# Patient Record
Sex: Female | Born: 1955 | Race: White | Hispanic: No | Marital: Single | Smoking: Former smoker
Health system: Southern US, Community
[De-identification: ages and names within clinical notes are randomized; demographics above are authoritative.]

## PROBLEM LIST (undated history)

## (undated) DIAGNOSIS — I10 Essential (primary) hypertension: Secondary | ICD-10-CM

## (undated) DIAGNOSIS — M1990 Unspecified osteoarthritis, unspecified site: Secondary | ICD-10-CM

## (undated) DIAGNOSIS — E079 Disorder of thyroid, unspecified: Secondary | ICD-10-CM

## (undated) DIAGNOSIS — F329 Major depressive disorder, single episode, unspecified: Secondary | ICD-10-CM

## (undated) DIAGNOSIS — M797 Fibromyalgia: Secondary | ICD-10-CM

## (undated) DIAGNOSIS — E119 Type 2 diabetes mellitus without complications: Secondary | ICD-10-CM

## (undated) DIAGNOSIS — F32A Depression, unspecified: Secondary | ICD-10-CM

## (undated) HISTORY — PX: HAND SURGERY: SHX662

## (undated) HISTORY — PX: THYROID SURGERY: SHX805

## (undated) HISTORY — PX: REPLACEMENT TOTAL KNEE BILATERAL: SUR1225

## (undated) HISTORY — PX: APPENDECTOMY: SHX54

## (undated) HISTORY — PX: ABDOMINAL HYSTERECTOMY: SHX81

---

## 2014-01-09 ENCOUNTER — Emergency Department: Payer: Self-pay | Admitting: Emergency Medicine

## 2014-01-09 LAB — COMPREHENSIVE METABOLIC PANEL
AST: 31 U/L (ref 15–37)
Albumin: 3.1 g/dL — ABNORMAL LOW (ref 3.4–5.0)
Alkaline Phosphatase: 75 U/L
Anion Gap: 3 — ABNORMAL LOW (ref 7–16)
BILIRUBIN TOTAL: 0.5 mg/dL (ref 0.2–1.0)
BUN: 11 mg/dL (ref 7–18)
CALCIUM: 8.5 mg/dL (ref 8.5–10.1)
CHLORIDE: 94 mmol/L — AB (ref 98–107)
CREATININE: 1.02 mg/dL (ref 0.60–1.30)
Co2: 32 mmol/L (ref 21–32)
EGFR (African American): >60
EGFR (Non-African Amer.): 59 — ABNORMAL LOW
Glucose: 189 mg/dL — ABNORMAL HIGH (ref 65–99)
Osmolality: 263 (ref 275–301)
POTASSIUM: 5.1 mmol/L (ref 3.5–5.1)
SGPT (ALT): 37 U/L
SODIUM: 129 mmol/L — AB (ref 136–145)
Total Protein: 7.1 g/dL (ref 6.4–8.2)

## 2014-01-09 LAB — CBC
HCT: 46.4 % (ref 35.0–47.0)
HGB: 14.7 g/dL (ref 12.0–16.0)
MCH: 29.9 pg (ref 26.0–34.0)
MCHC: 31.6 g/dL — AB (ref 32.0–36.0)
MCV: 95 fL (ref 80–100)
PLATELETS: 155 10*3/uL (ref 150–440)
RBC: 4.91 10*6/uL (ref 3.80–5.20)
RDW: 15.3 % — ABNORMAL HIGH (ref 11.5–14.5)
WBC: 7.4 10*3/uL (ref 3.6–11.0)

## 2014-01-09 LAB — TROPONIN I: Troponin-I: 0.03 ng/mL

## 2014-01-13 ENCOUNTER — Emergency Department: Payer: Self-pay | Admitting: Internal Medicine

## 2014-01-13 LAB — CBC
HCT: 43.6 % (ref 35.0–47.0)
HGB: 13.9 g/dL (ref 12.0–16.0)
MCH: 29.8 pg (ref 26.0–34.0)
MCHC: 31.8 g/dL — AB (ref 32.0–36.0)
MCV: 94 fL (ref 80–100)
Platelet: 143 10*3/uL — ABNORMAL LOW (ref 150–440)
RBC: 4.65 10*6/uL (ref 3.80–5.20)
RDW: 15.4 % — ABNORMAL HIGH (ref 11.5–14.5)
WBC: 7.4 10*3/uL (ref 3.6–11.0)

## 2014-01-13 LAB — COMPREHENSIVE METABOLIC PANEL
ANION GAP: 5 — AB (ref 7–16)
Albumin: 3 g/dL — ABNORMAL LOW (ref 3.4–5.0)
Alkaline Phosphatase: 64 U/L
BUN: 18 mg/dL (ref 7–18)
Bilirubin,Total: 0.5 mg/dL (ref 0.2–1.0)
CALCIUM: 7.9 mg/dL — AB (ref 8.5–10.1)
CO2: 35 mmol/L — AB (ref 21–32)
Chloride: 95 mmol/L — ABNORMAL LOW (ref 98–107)
Creatinine: 0.76 mg/dL (ref 0.60–1.30)
EGFR (African American): >60
EGFR (Non-African Amer.): >60
GLUCOSE: 157 mg/dL — AB (ref 65–99)
Osmolality: 275 (ref 275–301)
Potassium: 4.2 mmol/L (ref 3.5–5.1)
SGOT(AST): 21 U/L (ref 15–37)
SGPT (ALT): 35 U/L
SODIUM: 135 mmol/L — AB (ref 136–145)
TOTAL PROTEIN: 6.9 g/dL (ref 6.4–8.2)

## 2014-01-13 LAB — URINALYSIS, COMPLETE
BACTERIA: NONE SEEN
BILIRUBIN, UR: NEGATIVE
Glucose,UR: NEGATIVE mg/dL (ref 0–75)
Ketone: NEGATIVE
Leukocyte Esterase: NEGATIVE
Nitrite: NEGATIVE
Ph: 5 (ref 4.5–8.0)
RBC,UR: 23 /HPF (ref 0–5)
SPECIFIC GRAVITY: 1.025 (ref 1.003–1.030)
Squamous Epithelial: 6
WBC UR: NONE SEEN /HPF (ref 0–5)

## 2014-01-13 LAB — CK TOTAL AND CKMB (NOT AT ARMC)
CK, Total: 38 U/L
CK-MB: 1.4 ng/mL (ref 0.5–3.6)

## 2014-01-13 LAB — TROPONIN I: TROPONIN-I: 0.04 ng/mL

## 2014-01-13 LAB — PRO B NATRIURETIC PEPTIDE: B-Type Natriuretic Peptide: 1445 pg/mL — ABNORMAL HIGH (ref 0–125)

## 2014-02-01 ENCOUNTER — Emergency Department: Payer: Self-pay | Admitting: Internal Medicine

## 2014-02-01 LAB — CBC
HCT: 44.1 % (ref 35.0–47.0)
HGB: 14 g/dL (ref 12.0–16.0)
MCH: 30 pg (ref 26.0–34.0)
MCHC: 31.9 g/dL — ABNORMAL LOW (ref 32.0–36.0)
MCV: 94 fL (ref 80–100)
PLATELETS: 115 10*3/uL — AB (ref 150–440)
RBC: 4.68 10*6/uL (ref 3.80–5.20)
RDW: 16.3 % — ABNORMAL HIGH (ref 11.5–14.5)
WBC: 5.5 10*3/uL (ref 3.6–11.0)

## 2014-02-01 LAB — BASIC METABOLIC PANEL
Anion Gap: 6 — ABNORMAL LOW (ref 7–16)
BUN: 14 mg/dL (ref 7–18)
CO2: 36 mmol/L — AB (ref 21–32)
Calcium, Total: 8.3 mg/dL — ABNORMAL LOW (ref 8.5–10.1)
Chloride: 98 mmol/L (ref 98–107)
Creatinine: 0.86 mg/dL (ref 0.60–1.30)
EGFR (Non-African Amer.): >60
Glucose: 157 mg/dL — ABNORMAL HIGH (ref 65–99)
Osmolality: 283 (ref 275–301)
Potassium: 4.1 mmol/L (ref 3.5–5.1)
Sodium: 140 mmol/L (ref 136–145)

## 2014-02-01 LAB — PRO B NATRIURETIC PEPTIDE: B-TYPE NATIURETIC PEPTID: 2773 pg/mL — AB (ref 0–125)

## 2014-02-01 LAB — TROPONIN I

## 2014-02-06 ENCOUNTER — Observation Stay: Payer: Self-pay | Admitting: Internal Medicine

## 2014-02-06 LAB — COMPREHENSIVE METABOLIC PANEL
ALK PHOS: 77 U/L
ANION GAP: 9 (ref 7–16)
AST: 41 U/L — AB (ref 15–37)
Albumin: 3.6 g/dL (ref 3.4–5.0)
BUN: 16 mg/dL (ref 7–18)
Bilirubin,Total: 1.5 mg/dL — ABNORMAL HIGH (ref 0.2–1.0)
CHLORIDE: 90 mmol/L — AB (ref 98–107)
Calcium, Total: 9.1 mg/dL (ref 8.5–10.1)
Co2: 37 mmol/L — ABNORMAL HIGH (ref 21–32)
Creatinine: 0.88 mg/dL (ref 0.60–1.30)
EGFR (African American): >60
EGFR (Non-African Amer.): >60
Glucose: 131 mg/dL — ABNORMAL HIGH (ref 65–99)
Osmolality: 275 (ref 275–301)
POTASSIUM: 3.9 mmol/L (ref 3.5–5.1)
SGPT (ALT): 87 U/L — ABNORMAL HIGH
Sodium: 136 mmol/L (ref 136–145)
Total Protein: 8.3 g/dL — ABNORMAL HIGH (ref 6.4–8.2)

## 2014-02-06 LAB — URINALYSIS, COMPLETE
BACTERIA: NONE SEEN
BLOOD: NEGATIVE
Bilirubin,UR: NEGATIVE
Glucose,UR: NEGATIVE mg/dL (ref 0–75)
KETONE: NEGATIVE
LEUKOCYTE ESTERASE: NEGATIVE
NITRITE: NEGATIVE
PH: 8 (ref 4.5–8.0)
Protein: 100
RBC,UR: 10 /HPF (ref 0–5)
Specific Gravity: 1.018 (ref 1.003–1.030)
Squamous Epithelial: 1

## 2014-02-06 LAB — CBC
HCT: 49.7 % — AB (ref 35.0–47.0)
HGB: 16.3 g/dL — ABNORMAL HIGH (ref 12.0–16.0)
MCH: 29.9 pg (ref 26.0–34.0)
MCHC: 32.7 g/dL (ref 32.0–36.0)
MCV: 91 fL (ref 80–100)
Platelet: 151 10*3/uL (ref 150–440)
RBC: 5.44 10*6/uL — ABNORMAL HIGH (ref 3.80–5.20)
RDW: 15.9 % — ABNORMAL HIGH (ref 11.5–14.5)
WBC: 11.5 10*3/uL — ABNORMAL HIGH (ref 3.6–11.0)

## 2014-02-06 LAB — CARBAMAZEPINE LEVEL, TOTAL: CARBAMAZEPINE: 11.4 ug/mL (ref 4.0–12.0)

## 2014-02-06 LAB — AMMONIA: Ammonia, Plasma: <10 mcmol/L (ref 11–32)

## 2014-02-06 LAB — TROPONIN I: Troponin-I: 0.03 ng/mL

## 2014-02-06 LAB — CK: CK, TOTAL: 66 U/L

## 2014-02-07 LAB — BASIC METABOLIC PANEL
Anion Gap: 7 (ref 7–16)
BUN: 14 mg/dL (ref 7–18)
Calcium, Total: 8.5 mg/dL (ref 8.5–10.1)
Chloride: 94 mmol/L — ABNORMAL LOW (ref 98–107)
Co2: 36 mmol/L — ABNORMAL HIGH (ref 21–32)
Creatinine: 0.84 mg/dL (ref 0.60–1.30)
GLUCOSE: 126 mg/dL — AB (ref 65–99)
OSMOLALITY: 276 (ref 275–301)
Potassium: 3.7 mmol/L (ref 3.5–5.1)
SODIUM: 137 mmol/L (ref 136–145)

## 2014-02-07 LAB — CBC WITH DIFFERENTIAL/PLATELET
Basophil #: 0.1 10*3/uL (ref 0.0–0.1)
Basophil %: 0.6 %
Eosinophil #: 0.1 10*3/uL (ref 0.0–0.7)
Eosinophil %: 0.6 %
HCT: 47.7 % — ABNORMAL HIGH (ref 35.0–47.0)
HGB: 15.5 g/dL (ref 12.0–16.0)
Lymphocyte #: 1.2 10*3/uL (ref 1.0–3.6)
Lymphocyte %: 12.3 %
MCH: 30.1 pg (ref 26.0–34.0)
MCHC: 32.5 g/dL (ref 32.0–36.0)
MCV: 93 fL (ref 80–100)
Monocyte #: 1.2 x10 3/mm — ABNORMAL HIGH (ref 0.2–0.9)
Monocyte %: 12.1 %
Neutrophil #: 7.1 10*3/uL — ABNORMAL HIGH (ref 1.4–6.5)
Neutrophil %: 74.4 %
Platelet: 144 10*3/uL — ABNORMAL LOW (ref 150–440)
RBC: 5.13 10*6/uL (ref 3.80–5.20)
RDW: 16.5 % — ABNORMAL HIGH (ref 11.5–14.5)
WBC: 9.6 10*3/uL (ref 3.6–11.0)

## 2014-03-24 ENCOUNTER — Inpatient Hospital Stay: Payer: Self-pay | Admitting: Internal Medicine

## 2014-03-24 LAB — COMPREHENSIVE METABOLIC PANEL
ALBUMIN: 3.2 g/dL — AB (ref 3.4–5.0)
ALK PHOS: 54 U/L
ALT: 63 U/L
ANION GAP: 6 — AB (ref 7–16)
AST: 25 U/L (ref 15–37)
BUN: 14 mg/dL (ref 7–18)
Bilirubin,Total: 0.5 mg/dL (ref 0.2–1.0)
CALCIUM: 8.8 mg/dL (ref 8.5–10.1)
CHLORIDE: 97 mmol/L — AB (ref 98–107)
Co2: 32 mmol/L (ref 21–32)
Creatinine: 0.98 mg/dL (ref 0.60–1.30)
EGFR (Non-African Amer.): >60
Glucose: 321 mg/dL — ABNORMAL HIGH (ref 65–99)
Osmolality: 283 (ref 275–301)
POTASSIUM: 4 mmol/L (ref 3.5–5.1)
SODIUM: 135 mmol/L — AB (ref 136–145)
Total Protein: 6.8 g/dL (ref 6.4–8.2)

## 2014-03-24 LAB — CBC
HCT: 35.4 % (ref 35.0–47.0)
HGB: 11.5 g/dL — AB (ref 12.0–16.0)
MCH: 30.4 pg (ref 26.0–34.0)
MCHC: 32.4 g/dL (ref 32.0–36.0)
MCV: 94 fL (ref 80–100)
Platelet: 171 10*3/uL (ref 150–440)
RBC: 3.77 10*6/uL — AB (ref 3.80–5.20)
RDW: 17.6 % — AB (ref 11.5–14.5)
WBC: 7.6 10*3/uL (ref 3.6–11.0)

## 2014-03-24 LAB — CK TOTAL AND CKMB (NOT AT ARMC)
CK, TOTAL: 38 U/L (ref 26–192)
CK-MB: 1.5 ng/mL (ref 0.5–3.6)

## 2014-03-24 LAB — TROPONIN I

## 2014-03-25 LAB — CBC WITH DIFFERENTIAL/PLATELET
Basophil #: 0 10*3/uL (ref 0.0–0.1)
Basophil %: 0.5 %
EOS ABS: 0 10*3/uL (ref 0.0–0.7)
Eosinophil %: 0 %
HCT: 38.8 % (ref 35.0–47.0)
HGB: 12.8 g/dL (ref 12.0–16.0)
Lymphocyte #: 0.7 10*3/uL — ABNORMAL LOW (ref 1.0–3.6)
Lymphocyte %: 6.7 %
MCH: 31 pg (ref 26.0–34.0)
MCHC: 32.9 g/dL (ref 32.0–36.0)
MCV: 94 fL (ref 80–100)
MONOS PCT: 2.6 %
Monocyte #: 0.3 x10 3/mm (ref 0.2–0.9)
NEUTROS ABS: 9 10*3/uL — AB (ref 1.4–6.5)
Neutrophil %: 90.2 %
PLATELETS: 195 10*3/uL (ref 150–440)
RBC: 4.13 10*6/uL (ref 3.80–5.20)
RDW: 17.4 % — AB (ref 11.5–14.5)
WBC: 9.9 10*3/uL (ref 3.6–11.0)

## 2014-03-25 LAB — BASIC METABOLIC PANEL
ANION GAP: 6 — AB (ref 7–16)
BUN: 15 mg/dL (ref 7–18)
CALCIUM: 8.8 mg/dL (ref 8.5–10.1)
CO2: 33 mmol/L — AB (ref 21–32)
Chloride: 95 mmol/L — ABNORMAL LOW (ref 98–107)
Creatinine: 1.01 mg/dL (ref 0.60–1.30)
EGFR (Non-African Amer.): 59 — ABNORMAL LOW
Glucose: 328 mg/dL — ABNORMAL HIGH (ref 65–99)
Osmolality: 282 (ref 275–301)
Potassium: 4.7 mmol/L (ref 3.5–5.1)
SODIUM: 134 mmol/L — AB (ref 136–145)

## 2014-03-28 LAB — BASIC METABOLIC PANEL
Anion Gap: 5 — ABNORMAL LOW (ref 7–16)
BUN: 14 mg/dL (ref 7–18)
CHLORIDE: 90 mmol/L — AB (ref 98–107)
Calcium, Total: 8.4 mg/dL — ABNORMAL LOW (ref 8.5–10.1)
Co2: 37 mmol/L — ABNORMAL HIGH (ref 21–32)
Creatinine: 1.02 mg/dL (ref 0.60–1.30)
GFR CALC NON AF AMER: 59 — AB
GLUCOSE: 392 mg/dL — AB (ref 65–99)
OSMOLALITY: 281 (ref 275–301)
POTASSIUM: 4.3 mmol/L (ref 3.5–5.1)
Sodium: 132 mmol/L — ABNORMAL LOW (ref 136–145)

## 2014-03-28 LAB — HEMOGLOBIN A1C: HEMOGLOBIN A1C: 7.1 % — AB (ref 4.2–6.3)

## 2014-04-17 ENCOUNTER — Emergency Department: Payer: Self-pay | Admitting: Emergency Medicine

## 2014-04-17 LAB — COMPREHENSIVE METABOLIC PANEL
ALBUMIN: 3.6 g/dL (ref 3.4–5.0)
ANION GAP: 10 (ref 7–16)
AST: 28 U/L (ref 15–37)
Alkaline Phosphatase: 55 U/L
BILIRUBIN TOTAL: 0.7 mg/dL (ref 0.2–1.0)
BUN: 37 mg/dL — AB (ref 7–18)
CHLORIDE: 94 mmol/L — AB (ref 98–107)
CREATININE: 1.95 mg/dL — AB (ref 0.60–1.30)
Calcium, Total: 9.1 mg/dL (ref 8.5–10.1)
Co2: 27 mmol/L (ref 21–32)
EGFR (African American): 34 — ABNORMAL LOW
EGFR (Non-African Amer.): 28 — ABNORMAL LOW
GLUCOSE: 120 mg/dL — AB (ref 65–99)
Osmolality: 273 (ref 275–301)
Potassium: 4.6 mmol/L (ref 3.5–5.1)
SGPT (ALT): 35 U/L
SODIUM: 131 mmol/L — AB (ref 136–145)
Total Protein: 7.4 g/dL (ref 6.4–8.2)

## 2014-04-17 LAB — BASIC METABOLIC PANEL
ANION GAP: 7 (ref 7–16)
BUN: 35 mg/dL — AB (ref 7–18)
CO2: 28 mmol/L (ref 21–32)
Calcium, Total: 8 mg/dL — ABNORMAL LOW (ref 8.5–10.1)
Chloride: 98 mmol/L (ref 98–107)
Creatinine: 1.67 mg/dL — ABNORMAL HIGH (ref 0.60–1.30)
EGFR (African American): 40 — ABNORMAL LOW
EGFR (Non-African Amer.): 33 — ABNORMAL LOW
Glucose: 113 mg/dL — ABNORMAL HIGH (ref 65–99)
OSMOLALITY: 275 (ref 275–301)
Potassium: 4.4 mmol/L (ref 3.5–5.1)
Sodium: 133 mmol/L — ABNORMAL LOW (ref 136–145)

## 2014-04-17 LAB — CBC
HCT: 36.2 % (ref 35.0–47.0)
HGB: 12.1 g/dL (ref 12.0–16.0)
MCH: 30.4 pg (ref 26.0–34.0)
MCHC: 33.4 g/dL (ref 32.0–36.0)
MCV: 91 fL (ref 80–100)
Platelet: 219 10*3/uL (ref 150–440)
RBC: 3.99 10*6/uL (ref 3.80–5.20)
RDW: 15.4 % — ABNORMAL HIGH (ref 11.5–14.5)
WBC: 8 10*3/uL (ref 3.6–11.0)

## 2014-04-17 LAB — TSH: Thyroid Stimulating Horm: 0.261 u[IU]/mL — ABNORMAL LOW

## 2014-04-17 LAB — TROPONIN I: Troponin-I: <0.02 ng/mL

## 2014-04-17 LAB — T4, FREE: Free Thyroxine: 1.25 ng/dL (ref 0.76–1.46)

## 2014-04-17 LAB — CK TOTAL AND CKMB (NOT AT ARMC): CK, Total: 23 U/L — ABNORMAL LOW (ref 26–192)

## 2014-04-17 LAB — PRO B NATRIURETIC PEPTIDE: B-Type Natriuretic Peptide: 190 pg/mL — ABNORMAL HIGH (ref 0–125)

## 2014-07-28 NOTE — Discharge Summary (Signed)
PATIENT NAME:  Amber Stevenson, Amber Stevenson MR#:  [id number] DATE OF BIRTH:  Jul 06, 1955  DATE OF ADMISSION:  02/06/2014 DATE OF DISCHARGE:  02/07/2014  DISCHARGE DIAGNOSES:   1. Metabolic encephalopathy secondary to polypharmacy.  2.  Fall at home.  3.  Fibromyalgia.  4.  Chronic obstructive pulmonary disease.  5.  Orthostatic hypotension.   CONSULTATIONS: Physical therapy.  DISCHARGE ACTIVITY: Discharged home with home physical therapy.   DISCHARGE MEDICATIONS: 1.  Levothyroxine 175 mcg p.o. daily.  2.  Omeprazole 20 mg p.o. daily. 3.  Albuterol inhalers every 6 hours as needed for wheezing. 4.  Diazepam 5 mg every 8 hours for anxiety.  5.  Duloxetine 60 mg daily.  6.  Carbamazepine 400 mg twice a day.  7.  Flexeril 10 mg p.o. b.i.d.  8.  Neurontin 300 mg 2 capsules p.o. b.i.d.  9.  Percocet 5/325 every 8 hours as needed.  10.  Zofran 4 mg every 8 hours as needed.  11.  Symbicort 160/4.5 two puffs b.i.d.  12.  Lasix is held for 2 to 3 days. Advised the patient to stop Lasix for 2 to 3 days and then follow with primary doctor.   HOSPITAL COURSE: A 59 year old female patient with history of fibromyalgia and hypertension brought in because of disorientation. She took her morning medications including Norco, Flexeril and diazepam. Her son fixed her medications and he left for the office. The patient had a fall due to balance issues and she was unable to get up since morning to evening when the granddaughter came from school and noticed that she was on the floor. At the same time, she noticed her to be slightly confused. Because of that she was brought into the Emergency Room. The patient was taking Neurontin, diazepam, Flexeril, Depakote and Percocet. Her CT head was unremarkable. The patient admitted to medical service for altered mental status thought to be secondary to her medications with polypharmacy. The patient's mental status improved and the patient was started on the diet the same day morning.  She tolerated the food. She was alert and oriented. The patient told me that she did not take any extra pills. Usually her son fixes her medications. The patient's medications are not changed, especially the Neurontin or Tegretol. The patient's symptoms of confusion resolved and according to the daughter her mom is getting recently confused and has some memory problems. The patient has family history of fairly early onset dementia.   Her work-up has been negative here including CT head, chest x-ray, and ankle x-rays. The patient's ankle x-ray is done because of concern for swelling in the right ankle. The patient has soft tissue swelling but no fractures in the right ankle. The patient's ABG showed pH 7.5, pCO2 52 and pO2 52. Her urine looked clear. Electrolytes: Sodium was 137, potassium 3.7, chloride 94, bicarbonate 36, BUN 14, creatinine 0.84 and glucose 126. The patient's lungs were clear.   On physical exam at the time of discharge, cardiovascular system with S1 and S2 regular. Lungs clear. The patient was alert, oriented and did not have any focal neurological deficit. Discharge vitals: Temperature 98.3, heart rate 90, blood pressure 140/68.   The patient was seen by physical therapy. Physical therapy noticed a 20 point drop in blood pressure from lying down to standing and told the patient to stop Lasix for 2 to 3 days and follow with primary doctor. We arranged home physical therapy.   TIME SPENT ON DISCHARGE PREPARATION: More than 35 minutes.  ____________________________ Katha HammingSnehalatha Tyiana Hill, MD sk:sb D: 02/08/2014 13:17:00 ET T: 02/08/2014 14:12:00 ET JOB#: [id number]  cc: Katha HammingSnehalatha Rhyli Depaula, MD, <Dictator> Katha HammingSNEHALATHA Shelbe Haglund MD ELECTRONICALLY SIGNED 02/22/2014 8:26

## 2014-07-28 NOTE — H&P (Signed)
[facility] NAME:  Amber Stevenson, Amber Stevenson MR#:  [id number] DATE OF BIRTH:  12/11/1955  DATE OF ADMISSION:  02/06/2014  PRIMARY CARE PHYSICIAN:  Nonlocal  REFERRING PHYSICIAN:  Cory R. York CeriseForbach, MD   CHIEF COMPLAINT:  Altered mental status.   HISTORY OF PRESENT ILLNESS:  [name] is a 59 year old female with history of COPD, bipolar disorder, and fibromyalgia, who has been having frequent falls in Amber last few days. Amber Stevenson this morning fell down, could not get up from Amber floor, laid on Amber floor until Amber Stevenson's daughter found her at home. Amber Stevenson is on Neurontin 600 mg 2 times a day, diazepam, Flexeril, Depakote, and Percocet. Amber Stevenson is able to provide Amber history, however, when Emergency Department physician examined Amber Stevenson, she was found to be somewhat confused. No obvious signs of any infection are seen. Amber Stevenson denies having any cough or shortness of breath. She has been constipated. CT of Amber head without contrast was unremarkable.   PAST MEDICAL HISTORY: 1.  COPD.  2.  Fibromyalgia.  3.  Bipolar disorder.   PAST SURGICAL HISTORY: 1.  Bilateral knee replacement.  2.  Hysterectomy.   ALLERGIES:  No known drug allergies.   HOME MEDICATIONS: 1.  Norco 5/325 mg every 8 hours as needed.  2.  Albuterol every 6 hours as needed.  3.  Tegretol extended release 400 mg 2 times a day.  4.  Flexeril 10 mg 2 times a day.  5.  Diazepam 5 mg every 8 hours as needed.  6.  Duloxetine 60 mg once a day.  7.  Lasix 20 mg once a day.  8.  Levothyroxine 175 mcg once a day.  9.  Neurontin 600 mg 2 times a day.  10.  Omeprazole 20 mg once a day.  11.  Zofran 4 mg every 8 hours.  12.  Symbicort 2 puffs 2 times a day.   SOCIAL HISTORY:  Former smoker, quit 1-1/2 years back. Denies drinking alcohol or using illicit drugs. Lives with her son. Independent of ADLs.   FAMILY HISTORY:  Amber Stevenson's brother died from MI. Grandmother with breast cancer.   REVIEW OF  SYSTEMS: CONSTITUTIONAL:  Experiencing generalized weakness.  EYES:  No change in vision.  EARS, NOSE, AND THROAT:  No change in hearing.  RESPIRATORY:  No cough or shortness of breath.  CARDIOVASCULAR:  No chest pain or palpitations.  GASTROINTESTINAL:  No nausea, vomiting, or abdominal pain.  GENITOURINARY:  No dysuria or hematuria.  HEMATOLOGIC:  No easy bruising or bleeding.  SKIN:  No rash or lesions.  MUSCULOSKELETAL:  Has fibromyalgia.  NEUROLOGIC:  No weakness or numbness in any part of Amber body.   PHYSICAL EXAMINATION: GENERAL:  This is a well-built, well-nourished, obese female lying down in Amber bed, not in distress.  VITAL SIGNS:  Temperature 98.1, pulse 98, blood pressure 155/79, respiratory rate 19, oxygen saturation 94% on room air.  HEENT:  Head is normocephalic and atraumatic. There is no scleral icterus. Conjunctivae are normal. Pupils are equal and react to light. Mucous membranes are moist. No pharyngeal erythema.  NECK:  Supple. No lymphadenopathy. No JVD. No carotid bruit. No thyromegaly.  CHEST:  Has no focal tenderness. Good air entry bilaterally. HEART:  S1 and S2, regular. No murmurs are heard.  ABDOMEN:  Bowel sounds present. Soft, nontender, nondistended.  EXTREMITIES:  No pedal edema. Pulses are 2+.  SKIN:  No rash or lesions.  MUSCULOSKELETAL:  Good range of motion in all  of Amber extremities.  NEUROLOGIC:  Amber Stevenson is currently alert and oriented to place, person, and time. Cranial nerves II through XII are intact. Motor is 5/5 in upper and lower extremities.   LABORATORY DATA:  CBC is completely within normal limits. BMP is completely within normal limits. BNP is 2700. Tegretol level is 11.   IMAGING:  CT of Amber head without contrast:  No acute intracranial abnormality. Chest x-ray, PA and lateral:  No acute cardiopulmonary disease. X-ray of Amber right ankle:  Concern for soft tissue swelling.    ASSESSMENT AND PLAN:  Amber Stevenson is a 59 year old female who  comes with frequent falls and unable to get up from Amber floor.   1.  Frequent falls. Admit Amber Stevenson under observation. Continue with IV fluids. Amber Stevenson seems to be more on Amber dry side. Involve physical therapy in Amber morning. Hold all sedative medications for now and follow up.  2.  Bipolar disorder. Continue with Tegretol and duloxetine.    3.  Fibromyalgia. Instructed Amber Stevenson to minimize Neurontin only to nighttime as well as Norco. Amber Stevenson expressed understanding.  4.  Debility. Involve with physical therapy and occupational therapy.   TIME SPENT:  55 minutes.    ____________________________ Susa Griffins, MD pv:nb D: 02/07/2014 03:50:41 ET T: 02/07/2014 04:40:07 ET JOB#: [id number]  cc: Susa Griffins, MD, <Dictator> Susa Griffins MD ELECTRONICALLY SIGNED 02/14/2014 21:10

## 2014-07-28 NOTE — H&P (Signed)
PATIENT NAME:  Amber Stevenson, Amber Stevenson MR#:  [id number] DATE OF BIRTH:  1955-10-10  DATE OF ADMISSION:  03/24/2014   REFERRING EMERGENCY ROOM PHYSICIAN:  Janalyn Harder, M.D.   CHIEF COMPLAINT: Shortness of breath.   HISTORY OF PRESENT ILLNESS: The patient is a 59 year old female with past medical history of fibromyalgia and bipolar disorder, has been experiencing shortness of breath for the past 4 days. The patient went to see her doctor on Tuesday and she was given p.o. prednisone. Initially, she felt like she was getting better with the prednisone and breathing treatments but sending her shortness of breath has been worse.  She was having wheezing and shortness of breath has been worse and came into the ED.  Her respiratory rate was initially at 26.  The patient has refused IV medications by EMS when coming to the ED.  The patient was given several breathing treatments, as the patient was short of breath with minimal exertion, the hospitalist team is called to admit the patient.  She also has received 60 mg of IV Solu-Medrol. The patient took 40 of her p.o. prednisone at home.  During my examination, the patient is feeling slightly better.  She reports that she is feeling okay if she is resting. Denies any chest pain.  She was smoking until 8 weeks ago and then quit smoking. Initially, she was at 87% on room air, but her pulse oximetry went up to 96% on 2 liters. No family members are at bedside. No other complaints. Denies any sick contacts.    PAST MEDICAL HISTORY:  Fibromyalgia, cardiac murmur, bipolar disorder.   PAST SURGICAL HISTORY: Bilateral knee replacement, hysterectomy.   ALLERGIES: AUGMENTIN     PSYCHOSOCIAL HISTORY: She lives at home. She used to smoke, but quit smoking 8 weeks ago. Denies alcohol or illicit drug use. Lives with son   FAMILY HISTORY: Brother deceased from myocardial infarction, grandmother has history of breast cancer.     REVIEW OF SYSTEMS:  CONSTITUTIONAL: Denies any  fever, fatigue, weakness.  EYES: Denies blurry vision, double vision.  ENT: Denies epistaxis, discharge, postnasal drip.  RESPIRATORY: Complaining of cough, chronic history of chronic obstructive pulmonary disease, complaining wheezing and shortness of breath with minimal exertion.  CARDIOVASCULAR: No chest pain, palpitations, syncope.  GASTROINTESTINAL: Denies nausea, vomiting, diarrhea, abdominal pain.  GENITOURINARY: No dysuria, hematuria.  GYNECOLOGIC: Denies breast mass or vaginal discharge.  ENDOCRINE: Denies polyuria, nocturia, history of hypothyroidism.  HEMATOLOGIC AND LYMPHATIC:  No anemia, easy bruising or bleeding. MUSCULOSKELETAL: No joint pain in the neck and back. Denies gout.  NEUROLOGIC: Denies vertigo, ataxia. No CVA transient ischemic attack.  PSYCHIATRIC: Has bipolar disorder, no OCD.    HOME MEDICATIONS: Omeprazole 20 mg p.o. once daily, magnesium oxide 400 mg p.o. once daily, levothyroxine 175 mcg p.o. once daily, duloxetine 60 mg p.o. once daily, lorazepam 5 mg p.o. every 8 hours as needed for anxiety, cyclobenzaprine 10 mg p.o. b.i.d. as needed for muscle spasms, carbamazepine extended release 1 tablet p.o. b.i.d., budesonide/formoterol 2 puffs inhalation 2 times a day, aspirin 81 mg once daily, albuterol 2 puffs inhalation every 4 hours as needed, Percocet 325/5, 1 tablet p.o. every 8 hours.   PHYSICAL EXAMINATION:  VITAL SIGNS: Temperature 97.8, pulse 102, respirations 18, blood pressure 132/71, pulse oxygen 94%.  GENERAL APPEARANCE: Not in acute distress. Moderately built and nourished.  HEENT: Normocephalic, atraumatic. Pupils are equal and reactive to light and accommodation. NECK: Supple. No JVD. No thyromegaly. Range of motion is intact.  LUNGS:  Diffuse expiratory wheezing is present, no crackles. No rhonchi. No anterior chest wall tenderness on palpation.  No accessory muscle usage.  CARDIOVASCULAR: S1, S2 normal. Regular rate and rhythm. Positive murmur.   GASTROINTESTINAL: Soft. Bowel sounds are positive in all 4 quadrants. Nontender, nondistended. No hepatosplenomegaly. No masses.  NEUROLOGIC: Awake, alert, oriented x 3. Cranial nerves II through XII are grossly intact. Motor and sensory are intact. Reflexes are 2+  EXTREMITIES: No edema. No cyanosis. No clubbing.  SKIN: Warm to touch. Normal turgor. No rashes. No lesions.  MUSCULOSKELETAL: No joint effusion, tenderness. PSYCHIATRIC: Normal mood and affect.  LABORATORY DATA AND IMAGING STUDIES:  The first set of cardiac enzymes are negative. WBC normal.  Hemoglobin 11.7, hematocrit 35.4, platelets 171. LFTs, albumin 3.2, glucose 321, BUN and creatinine are normal. Sodium and potassium 4.0, chloride and CO2 is 32, GFR greater than 60. Anion gap 6.  Serum osmolality and calcium are normal. Chest x-ray 1 view reveals opacity of bilateral lung bases, likely related to radiographic technique.  Recommend a PA and lateral view of the chest for further evaluation. A 12-lead EKG: Normal sinus rhythm, sinus tachycardia at 94, normal PR and QRS interval. No acute ST-T wave changes.   ASSESSMENT AND PLAN:  A 59 year old female presenting to the Emergency Department with a chief complaint of worsening of shortness of breath. She was seen by her primary care physician and given p.o. prednisone with no significant improvement. The patient was given azithromycin in the Emergency Department and several nebulizer later treatments with no significant improvement and hospitalist team was called to admit the patient.  1.  Acute hypoxic respiratory failure from chronic obstructive pulmonary disease exacerbation. Right now, the patient is placed on 2 liters of oxygen and saturating at 95% to 96%.  2.  Acute chronic obstructive pulmonary disease exacerbation. The patient was given Solu-Medrol 60 mg IV in the Emergency Department as patient took prednisone 40 mg p.o. in the morning. We will continue Solu-Medrol 60 mg IV every 6  hours.  We will continue DuoNeb treatments and albuterol treatments on as needed basis. The patient will be given prophylactic antibiotics levofloxacin. If there is some opacity which is not clear, we will repeat PA and lateral views of chest x-ray to rule out any kind of pneumonia.  3.  Elevated blood pressure with no diagnosis of hypertension.  This could be from the stress and the respiratory distress. We will continue close monitoring and will provide her antihypertensives if needed.  4.  Fibromyalgia; continue her home medication. 5.  Bipolar disorder. At this point, the patient's mood is normal.  6.  History of hypothyroidism. Continue Synthroid. 7.  Cardiac murmur, no intervention is needed at this time.   8.  We will provide gastrointestinal and deep vein thrombosis prophylaxis.   CODE STATUS: She is FULL CODE.  Son is the medical power of attorney.   Plan of care was discussed with the patient. She verbalized understanding of the plan.   TOTAL TIME SPENT: 45 minutes.     ____________________________ Ramonita LabAruna Liboria Putnam, MD ag:DT D: 03/24/2014 12:55:36 ET T: 03/24/2014 13:24:03 ET JOB#: [id number]  cc: Ramonita LabAruna Gorden Stthomas, MD, <Dictator> Ramonita LabARUNA Lien Lyman MD ELECTRONICALLY SIGNED 03/31/2014 23:32

## 2014-08-01 NOTE — Discharge Summary (Signed)
PATIENT NAME:  Amber Stevenson, Amber Stevenson MR#:  [id number] DATE OF BIRTH:  18-Feb-1956  DATE OF ADMISSION:  03/24/2014 DATE OF DISCHARGE:  03/28/2014  DISCHARGE DIAGNOSES:  1.  Acute respiratory failure.  2.  Chronic obstructive pulmonary disease exacerbation.  3.  Right lower lobe pneumonia.  4.  Acute on chronic diastolic congestive heart failure.  5.  Hypertension.  6.  Diabetes mellitus.   DISCHARGE MEDICATIONS:  1. Levothyroxine 175 mcg daily.  2. Omeprazole 20 mg daily.  3. Albuterol nebulizer every 6 hours as needed.  4. Diazepam 5 mg every 8 hours as needed.  5. Duloxetine 60 mg daily.  6. Albuterol 1 puff inhaled every four 4 as needed.  7. Carbamazepine 400 mg 2 times a day.  8. Cyclobenzaprine 10 mg oral 2 times a day as needed.  9. Acetaminophen/hydrocodone 325/5 one tablet oral every 8 hours as needed.  10. Aspirin 81 mg daily.  11. Magnesium oxide 400 mg once a day.   12. Budesonide formoterol 160-4.5 mg 2 puffs inhaled 2 times a day.  13. Levaquin 750 mg every 24 hours.  15. Prednisone 60 mg tapered over 6 days.  16. Hydrochlorothiazide-lisinopril 12.5-20 mg oral once a day.  17. Lasix 20 mg daily.   18. Glucophage 1000 mg oral 2 times a day.   DISCHARGE INSTRUCTIONS: Home oxygen 2 liters continuous. Low-sodium, low-carbohydrate diet. Activity as tolerated. Follow up with primary care physician in 1-2 weeks.   IMAGING STUDIES: Showed pulmonary edema and right lower lobe pneumonia.   ADMITTING HISTORY AND PHYSICAL: Please see detailed H and P dictated previously. In brief a 59 year old patient who was admitted to the hospitalist service after she presented with shortness of breath, was found to have pulmonary edema, right lower lobe pneumonia, and acute respiratory failure.   HOSPITAL COURSE:  1.  Acute respiratory failure. This was secondary to combination of right lower lobe pneumonia, COPD exacerbation, and acute on chronic diastolic CHF. The patient's EF was 55%, was on  Lasix IV with which she improved well. At time of discharge she does not have any fluid overload, will be placed on Lasix 20 every other day. She has also been on IV antibiotics, got Duonebs and steroids with which she improved well. Prior to discharge the patient's lungs are clear. No edema.  Heart sounds, S1, S2. Abdomen soft.  2.  Hypertension and diabetes. The patient has new onset hypertension and diabetes which were diagnosed during the hospital stay. She has been started on new blood pressure medications along with metformin for her diabetes.  HbA1c 7.1. Lifestyle modifications have been advised. She will follow up with her primary care physician.   TIME SPENT ON DAY OF DISCHARGE IN DISCHARGE ACTIVITY: 40 minutes.     ____________________________ Molinda BailiffSrikar R. Leodis Alcocer, MD srs:bu D: 03/29/2014 13:24:21 ET T: 03/29/2014 14:35:26 ET JOB#: [id number]  cc: Wardell HeathSrikar R. Marleen Moret, MD, <Dictator> Orie FishermanSRIKAR R Bina Veenstra MD ELECTRONICALLY SIGNED 04/20/2014 12:44

## 2014-11-08 ENCOUNTER — Emergency Department
Admission: EM | Admit: 2014-11-08 | Discharge: 2014-11-09 | Disposition: A | Discharge status: Home / Self Care | Payer: Medicare Other | Attending: Emergency Medicine | Admitting: Emergency Medicine

## 2014-11-08 ENCOUNTER — Encounter: Payer: Self-pay | Admitting: Emergency Medicine

## 2014-11-08 DIAGNOSIS — Z87891 Personal history of nicotine dependence: Secondary | ICD-10-CM | POA: Diagnosis not present

## 2014-11-08 DIAGNOSIS — E1165 Type 2 diabetes mellitus with hyperglycemia: Secondary | ICD-10-CM | POA: Diagnosis present

## 2014-11-08 DIAGNOSIS — R739 Hyperglycemia, unspecified: Secondary | ICD-10-CM

## 2014-11-08 DIAGNOSIS — I10 Essential (primary) hypertension: Secondary | ICD-10-CM | POA: Diagnosis not present

## 2014-11-08 HISTORY — DX: Type 2 diabetes mellitus without complications: E11.9

## 2014-11-08 HISTORY — DX: Major depressive disorder, single episode, unspecified: F32.9

## 2014-11-08 HISTORY — DX: Essential (primary) hypertension: I10

## 2014-11-08 HISTORY — DX: Fibromyalgia: M79.7

## 2014-11-08 HISTORY — DX: Disorder of thyroid, unspecified: E07.9

## 2014-11-08 HISTORY — DX: Unspecified osteoarthritis, unspecified site: M19.90

## 2014-11-08 HISTORY — DX: Depression, unspecified: F32.A

## 2014-11-08 LAB — URINALYSIS COMPLETE WITH MICROSCOPIC (ARMC ONLY)
BACTERIA UA: NONE SEEN
BILIRUBIN URINE: NEGATIVE
Ketones, ur: NEGATIVE mg/dL
LEUKOCYTES UA: NEGATIVE
Nitrite: NEGATIVE
PH: 6 (ref 5.0–8.0)
PROTEIN: NEGATIVE mg/dL
Specific Gravity, Urine: 1.029 (ref 1.005–1.030)

## 2014-11-08 LAB — GLUCOSE, CAPILLARY
Glucose-Capillary: >600 mg/dL (ref 65–99)
Glucose-Capillary: >600 mg/dL (ref 65–99)

## 2014-11-08 LAB — CBC WITH DIFFERENTIAL/PLATELET
BASOS PCT: 1 %
Basophils Absolute: 0.1 10*3/uL (ref 0–0.1)
Eosinophils Absolute: 0.1 10*3/uL (ref 0–0.7)
Eosinophils Relative: 1 %
HEMATOCRIT: 41.6 % (ref 35.0–47.0)
Hemoglobin: 13.9 g/dL (ref 12.0–16.0)
Lymphocytes Relative: 28 %
Lymphs Abs: 2 10*3/uL (ref 1.0–3.6)
MCH: 29 pg (ref 26.0–34.0)
MCHC: 33.4 g/dL (ref 32.0–36.0)
MCV: 86.9 fL (ref 80.0–100.0)
MONO ABS: 0.6 10*3/uL (ref 0.2–0.9)
Monocytes Relative: 8 %
Neutro Abs: 4.6 10*3/uL (ref 1.4–6.5)
Neutrophils Relative %: 62 %
PLATELETS: 198 10*3/uL (ref 150–440)
RBC: 4.79 MIL/uL (ref 3.80–5.20)
RDW: 13.2 % (ref 11.5–14.5)
WBC: 7.4 10*3/uL (ref 3.6–11.0)

## 2014-11-08 LAB — BASIC METABOLIC PANEL
Anion gap: 12 (ref 5–15)
BUN: 23 mg/dL — ABNORMAL HIGH (ref 6–20)
CALCIUM: 9.2 mg/dL (ref 8.9–10.3)
CO2: 30 mmol/L (ref 22–32)
CREATININE: 1.3 mg/dL — AB (ref 0.44–1.00)
Chloride: 87 mmol/L — ABNORMAL LOW (ref 101–111)
GFR calc Af Amer: 50 mL/min — ABNORMAL LOW (ref >60)
GFR calc non Af Amer: 43 mL/min — ABNORMAL LOW (ref >60)
Glucose, Bld: 683 mg/dL (ref 65–99)
Potassium: 4.4 mmol/L (ref 3.5–5.1)
SODIUM: 129 mmol/L — AB (ref 135–145)

## 2014-11-08 MED ORDER — SODIUM CHLORIDE 0.9 % IV BOLUS (SEPSIS)
1000.0000 mL | Freq: Once | INTRAVENOUS | Status: AC
  Administered 2014-11-09: 1000 mL via INTRAVENOUS

## 2014-11-08 MED ORDER — INSULIN ASPART 100 UNIT/ML ~~LOC~~ SOLN
10.0000 [IU] | Freq: Once | SUBCUTANEOUS | Status: AC
  Administered 2014-11-08: 10 [IU] via INTRAVENOUS
  Filled 2014-11-08: qty 10

## 2014-11-08 MED ORDER — SODIUM CHLORIDE 0.9 % IV BOLUS (SEPSIS)
1000.0000 mL | Freq: Once | INTRAVENOUS | Status: AC
  Administered 2014-11-08: 1000 mL via INTRAVENOUS

## 2014-11-08 NOTE — ED Notes (Signed)
Pt sent here by PCP, was told blood sugar was 800. Pt diagnosed today with diabetes.

## 2014-11-08 NOTE — ED Provider Notes (Signed)
[facility] Emergency Department Provider Note   ____________________________________________  Time seen: 2357  I have reviewed the triage vital signs and the nursing notes.   HISTORY  Chief Complaint Hyperglycemia   History limited by: Not Limited   HPI Amber Stevenson is a 59 y.o. female who presents to the emergency department today after being told to present to the emergency department for elevated blood sugar. The patient went to her doctor for a yeast infection and whilst there had them check her blood work. Got a call roughly 1.5 hours ago informing her that her sugars were elevated and that she should present to the emergency department. The patient does state that she has been drinking a lot of water the past couple of days and has lost weight. She denies being started on metformin at discharge from her most recent hospitalization.   Past Medical History  Diagnosis Date  . Fibromyalgia   . Diabetes mellitus without complication   . Hypertension   . Arthritis   . Depression   . Thyroid disease     There are no active problems to display for this patient.   Past Surgical History  Procedure Laterality Date  . Abdominal hysterectomy    . Appendectomy    . Hand surgery Right   . Thyroid surgery    . Replacement total knee bilateral      No current outpatient prescriptions on file.  Allergies Augmentin and Lisinopril  No family history on file.  Social History History  Substance Use Topics  . Smoking status: Former Games developer  . Smokeless tobacco: Not on file  . Alcohol Use: No    Review of Systems  Constitutional: Negative for fever. Cardiovascular: Negative for chest pain. Respiratory: Negative for shortness of breath. Gastrointestinal: Negative for abdominal pain, vomiting and diarrhea. Genitourinary: Negative for dysuria. Musculoskeletal: Negative for back pain. Skin: Negative for rash. Neurological: Negative for  headaches, focal weakness or numbness.   10-point ROS otherwise negative.  ____________________________________________   PHYSICAL EXAM:  VITAL SIGNS: ED Triage Vitals  Enc Vitals Group     BP 11/08/14 2249 157/61 mmHg     Pulse Rate 11/08/14 2249 98     Resp 11/08/14 2249 20     Temp 11/08/14 2249 98.4 F (36.9 C)     Temp Source 11/08/14 2249 Oral     SpO2 11/08/14 2249 97 %     Weight 11/08/14 2249 195 lb (88.451 kg)     Height 11/08/14 2249 5\' 2"  (1.575 m)     Head Cir --      Peak Flow --      Pain Score 11/08/14 2250 6   Constitutional: Alert and oriented. Well appearing and in no distress. Eyes: Conjunctivae are normal. PERRL. Normal extraocular movements. ENT   Head: Normocephalic and atraumatic.   Nose: No congestion/rhinnorhea.   Mouth/Throat: Dry mucus membranes.    Neck: No stridor. Hematological/Lymphatic/Immunilogical: No cervical lymphadenopathy. Cardiovascular: Normal rate, regular rhythm.  No murmurs, rubs, or gallops. Respiratory: Normal respiratory effort without tachypnea nor retractions. Breath sounds are clear and equal bilaterally. No wheezes/rales/rhonchi. Gastrointestinal: Soft and nontender. No distention. There is no CVA tenderness. Genitourinary: Deferred Musculoskeletal: Normal range of motion in all extremities. No joint effusions.  No lower extremity tenderness nor edema. Neurologic:  Normal speech and language. No gross focal neurologic deficits are appreciated. Speech is normal.  Skin:  Skin is warm, dry and intact. No rash noted. Psychiatric: Mood and affect are normal.  Speech and behavior are normal. Patient exhibits appropriate insight and judgment.  ____________________________________________    LABS (pertinent positives/negatives)  Labs Reviewed  GLUCOSE, CAPILLARY - Abnormal; Notable for the following:    Glucose-Capillary >600 (*)    All other components within normal limits  GLUCOSE, CAPILLARY - Abnormal;  Notable for the following:    Glucose-Capillary >600 (*)    All other components within normal limits  BASIC METABOLIC PANEL - Abnormal; Notable for the following:    Sodium 129 (*)    Chloride 87 (*)    Glucose, Bld 683 (*)    BUN 23 (*)    Creatinine, Ser 1.30 (*)    GFR calc non Af Amer 43 (*)    GFR calc Af Amer 50 (*)    All other components within normal limits  URINALYSIS COMPLETEWITH MICROSCOPIC ([facility] ONLY) - Abnormal; Notable for the following:    Color, Urine STRAW (*)    APPearance CLEAR (*)    Glucose, UA >500 (*)    Hgb urine dipstick 1+ (*)    Squamous Epithelial / LPF 0-5 (*)    All other components within normal limits  CBC WITH DIFFERENTIAL/PLATELET  CBG MONITORING, ED     ____________________________________________   EKG  None  ____________________________________________    RADIOLOGY  None  ____________________________________________   PROCEDURES  Procedure(s) performed: None  Critical Care performed: Yes, see critical care note(s)  CRITICAL CARE Performed by: Phineas Semen   Total critical care time: 30  Critical care time was exclusive of separately billable procedures and treating other patients.  Critical care was necessary to treat or prevent imminent or life-threatening deterioration.  Critical care was time spent personally by me on the following activities: development of treatment plan with patient and/or surrogate as well as nursing, discussions with consultants, evaluation of patient's response to treatment, examination of patient, obtaining history from patient or surrogate, ordering and performing treatments and interventions, ordering and review of laboratory studies, ordering and review of radiographic studies, pulse oximetry and re-evaluation of patient's condition.  ____________________________________________   INITIAL IMPRESSION / ASSESSMENT AND PLAN / ED COURSE  Pertinent labs & imaging results that were  available during my care of the patient were reviewed by me and considered in my medical decision making (see chart for details).  Patient presents to the emergency department today with concerns for elevated blood sugar. Was sugar was greater than 600 on initial presentation. Patient's physical exam relatively benign. Patient was given multiple fluid boluses as well as IV insulin. This did successfully bring her sugars down. The patient was then observed to make sure that the sugars did not drop too low. Patient sugars were 263 at time of discharge. Did discuss return precautions with patient. Patient is receiving glucometer and medications tomorrow.  ____________________________________________   FINAL CLINICAL IMPRESSION(S) / ED DIAGNOSES  Final diagnoses:  Hyperglycemia     Phineas Semen, MD 11/09/14 [phone]

## 2014-11-08 NOTE — ED Notes (Signed)
Pt reports hyperglycemia, urinary frequency, increased thirst, nausea w/o vomiting.  Pt dx today with diabetes. Pt reports family hx.

## 2014-11-09 LAB — GLUCOSE, CAPILLARY
GLUCOSE-CAPILLARY: 191 mg/dL — AB (ref 65–99)
GLUCOSE-CAPILLARY: 267 mg/dL — AB (ref 65–99)
Glucose-Capillary: 203 mg/dL — ABNORMAL HIGH (ref 65–99)
Glucose-Capillary: 141 mg/dL — ABNORMAL HIGH (ref 65–99)

## 2014-11-09 NOTE — ED Notes (Signed)
Per Dr. Derrill Kay, pt given boxed meal and to recheck CBG in half hour

## 2014-11-09 NOTE — ED Notes (Signed)
CBG 203 

## 2014-11-09 NOTE — ED Notes (Signed)
Dr. Goodman at bedside.  

## 2014-11-09 NOTE — Discharge Instructions (Signed)
Please seek medical attention for any high fevers, chest pain, shortness of breath, change in behavior, persistent vomiting, bloody stool or any other new or concerning symptoms. ° °Hyperglycemia °Hyperglycemia occurs when the glucose (sugar) in your blood is too high. Hyperglycemia can happen for many reasons, but it most often happens to people who do not know they have diabetes or are not managing their diabetes properly.  °CAUSES  °Whether you have diabetes or not, there are other causes of hyperglycemia. Hyperglycemia can occur when you have diabetes, but it can also occur in other situations that you might not be as aware of, such as: °Diabetes °· If you have diabetes and are having problems controlling your blood glucose, hyperglycemia could occur because of some of the following reasons: °¨ Not following your meal plan. °¨ Not taking your diabetes medications or not taking it properly. °¨ Exercising less or doing less activity than you normally do. °¨ Being sick. °Pre-diabetes °· This cannot be ignored. Before people develop Type 2 diabetes, they almost always have "pre-diabetes." This is when your blood glucose levels are higher than normal, but not yet high enough to be diagnosed as diabetes. Research has shown that some long-term damage to the body, especially the heart and circulatory system, may already be occurring during pre-diabetes. If you take action to manage your blood glucose when you have pre-diabetes, you may delay or prevent Type 2 diabetes from developing. °Stress °· If you have diabetes, you may be "diet" controlled or on oral medications or insulin to control your diabetes. However, you may find that your blood glucose is higher than usual in the hospital whether you have diabetes or not. This is often referred to as "stress hyperglycemia." Stress can elevate your blood glucose. This happens because of hormones put out by the body during times of stress. If stress has been the cause of  your high blood glucose, it can be followed regularly by your caregiver. That way he/she can make sure your hyperglycemia does not continue to get worse or progress to diabetes. °Steroids °· Steroids are medications that act on the infection fighting system (immune system) to block inflammation or infection. One side effect can be a rise in blood glucose. Most people can produce enough extra insulin to allow for this rise, but for those who cannot, steroids make blood glucose levels go even higher. It is not unusual for steroid treatments to "uncover" diabetes that is developing. It is not always possible to determine if the hyperglycemia will go away after the steroids are stopped. A special blood test called an A1c is sometimes done to determine if your blood glucose was elevated before the steroids were started. °SYMPTOMS °· Thirsty. °· Frequent urination. °· Dry mouth. °· Blurred vision. °· Tired or fatigue. °· Weakness. °· Sleepy. °· Tingling in feet or leg. °DIAGNOSIS  °Diagnosis is made by monitoring blood glucose in one or all of the following ways: °· A1c test. This is a chemical found in your blood. °· Fingerstick blood glucose monitoring. °· Laboratory results. °TREATMENT  °First, knowing the cause of the hyperglycemia is important before the hyperglycemia can be treated. Treatment may include, but is not be limited to: °· Education. °· Change or adjustment in medications. °· Change or adjustment in meal plan. °· Treatment for an illness, infection, etc. °· More frequent blood glucose monitoring. °· Change in exercise plan. °· Decreasing or stopping steroids. °· Lifestyle changes. °HOME CARE INSTRUCTIONS  °· Test your blood glucose   as directed. °· Exercise regularly. Your caregiver will give you instructions about exercise. Pre-diabetes or diabetes which comes on with stress is helped by exercising. °· Eat wholesome, balanced meals. Eat often and at regular, fixed times. Your caregiver or nutritionist  will give you a meal plan to guide your sugar intake. °· Being at an ideal weight is important. If needed, losing as little as 10 to 15 pounds may help improve blood glucose levels. °SEEK MEDICAL CARE IF:  °· You have questions about medicine, activity, or diet. °· You continue to have symptoms (problems such as increased thirst, urination, or weight gain). °SEEK IMMEDIATE MEDICAL CARE IF:  °· You are vomiting or have diarrhea. °· Your breath smells fruity. °· You are breathing faster or slower. °· You are very sleepy or incoherent. °· You have numbness, tingling, or pain in your feet or hands. °· You have chest pain. °· Your symptoms get worse even though you have been following your caregiver's orders. °· If you have any other questions or concerns. °Document Released: 09/16/2000 Document Revised: 06/15/2011 Document Reviewed: 07/20/2011 °ExitCare® Patient Information ©2015 [employer]. This information is not intended to replace advice given to you by your health care provider. Make sure you discuss any questions you have with your health care provider. ° °

## 2014-11-09 NOTE — ED Notes (Signed)
CBG >600 °

## 2014-12-15 IMAGING — CT CT HEAD WITHOUT CONTRAST
1 of 2 series · 16 of 30 positions shown, 20 images · non-contrast
Comparison: 01/09/2014

CLINICAL DATA: Fall.  Found on floor with head pain.

EXAM:
CT HEAD WITHOUT CONTRAST
TECHNIQUE: Contiguous axial images were obtained from the base of the skull
through the vertex without contrast.

[Series 2: head wo · axial · 0.40mm/px · z∈[-133,+11]mm · 16 of 36 slices shown, 20 images]
[im 2/36  brain]
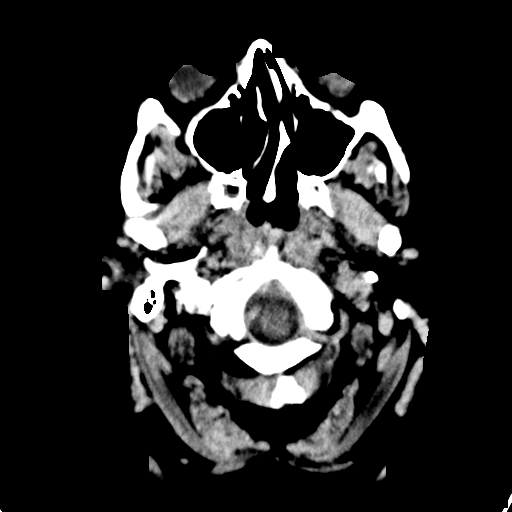
[im 2/36  bone]
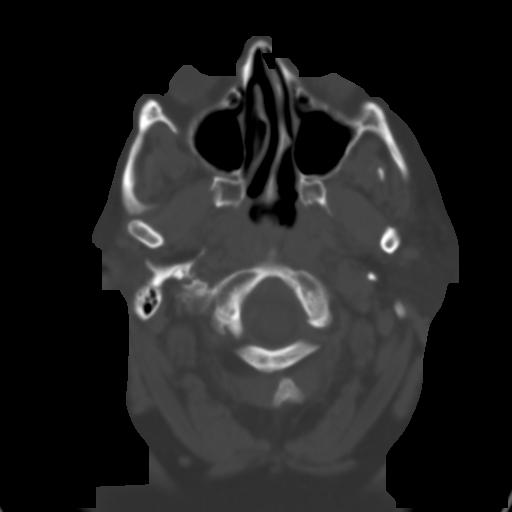
[im 4/36  brain]
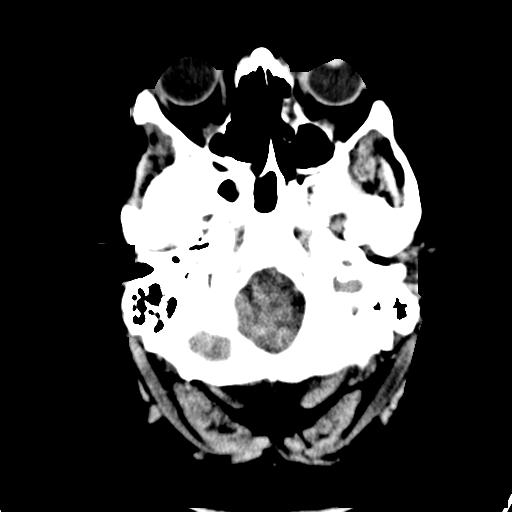
[im 7/36  brain]
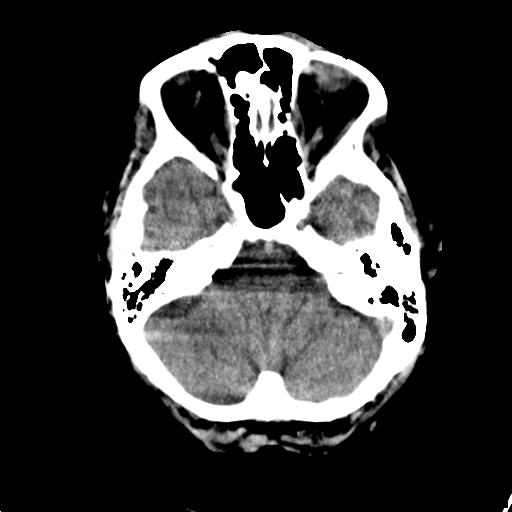
[im 9/36  brain]
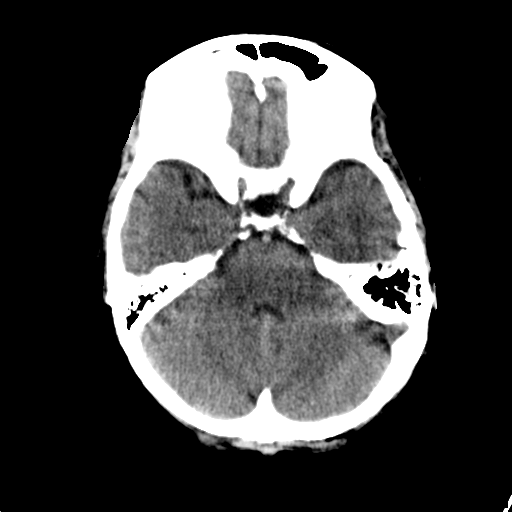
[im 11/36  brain]
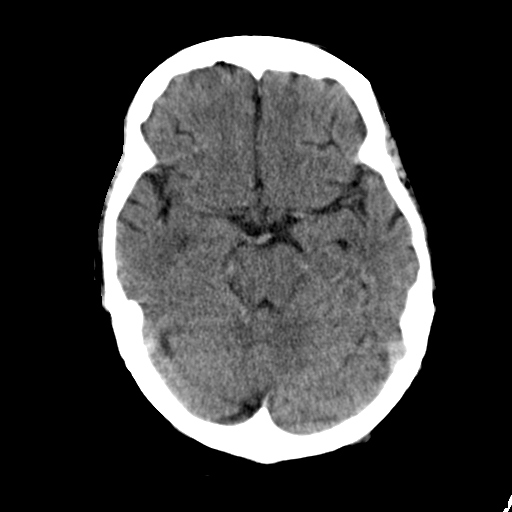
[im 11/36  bone]
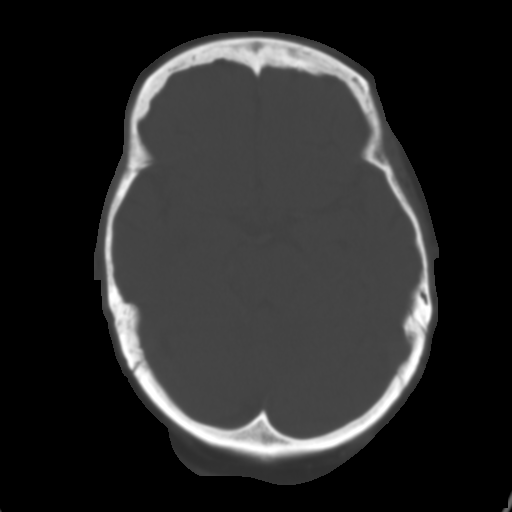
[im 12/36  brain]
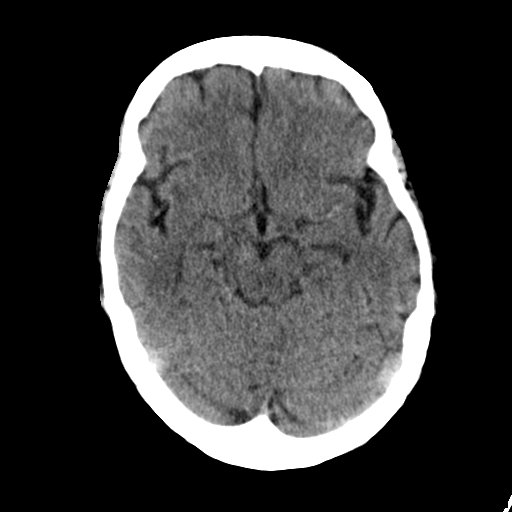
[im 16/36  brain]
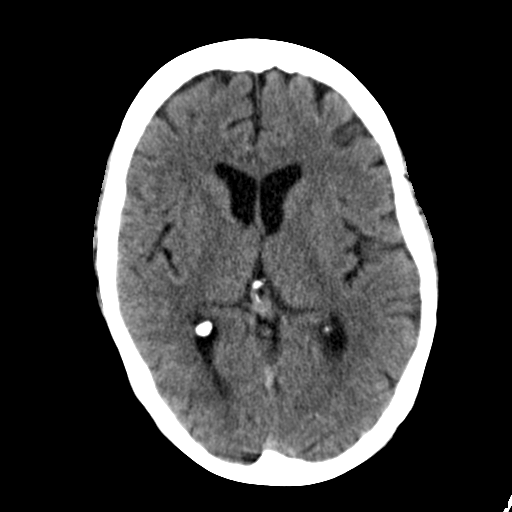
[im 17/36  brain]
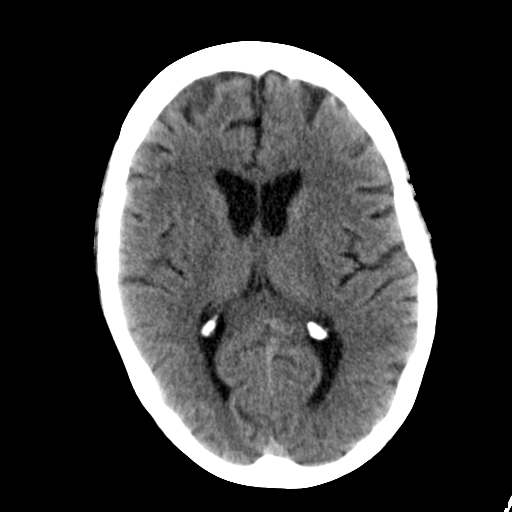
[im 19/36  brain]
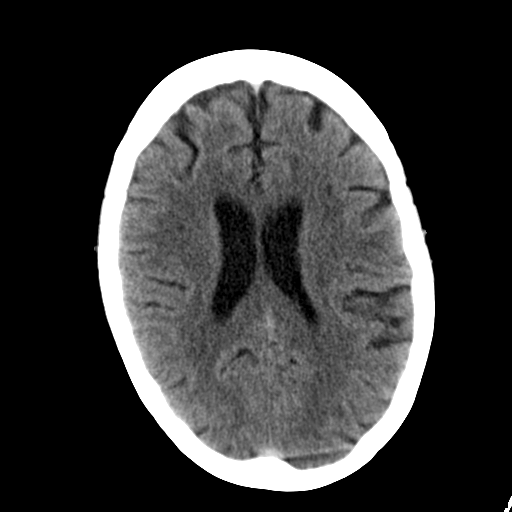
[im 19/36  bone]
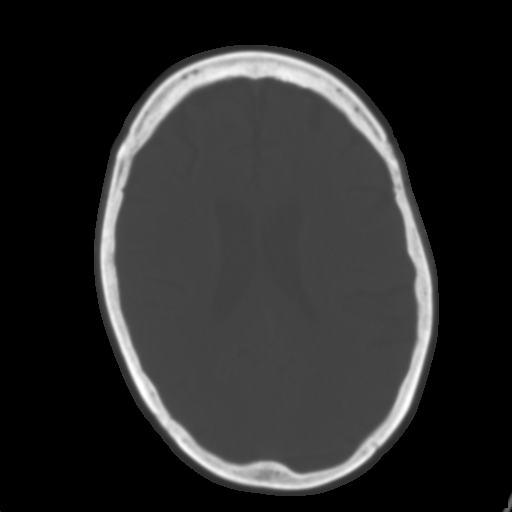
[im 21/36  brain]
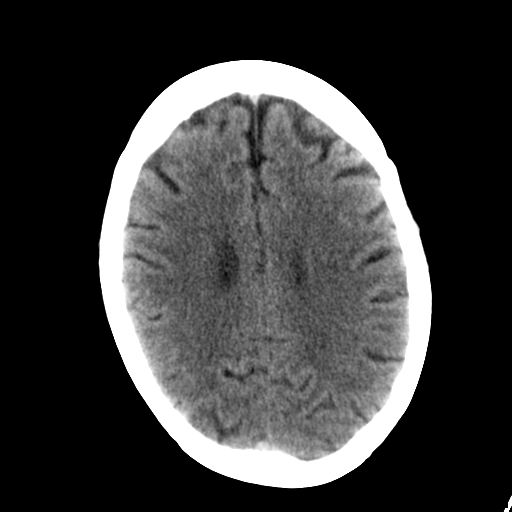
[im 24/36  brain]
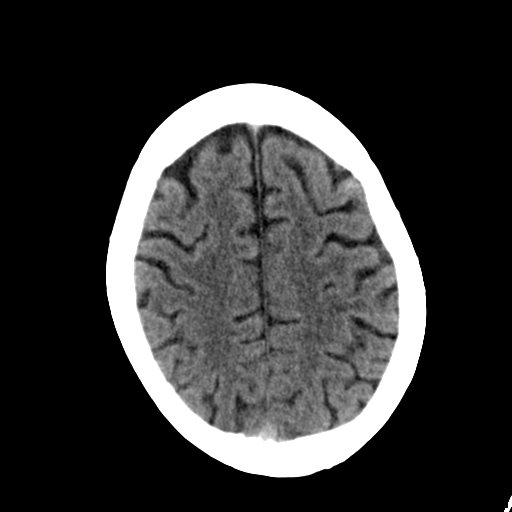
[im 26/36  brain]
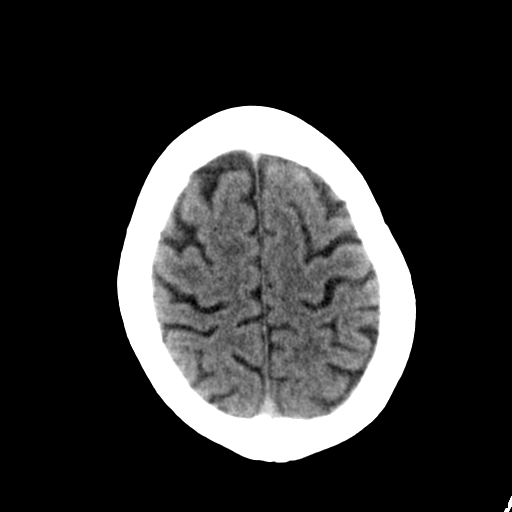
[im 27/36  brain]
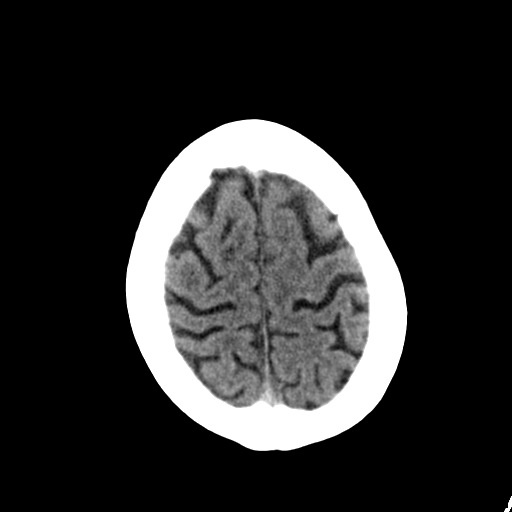
[im 27/36  bone]
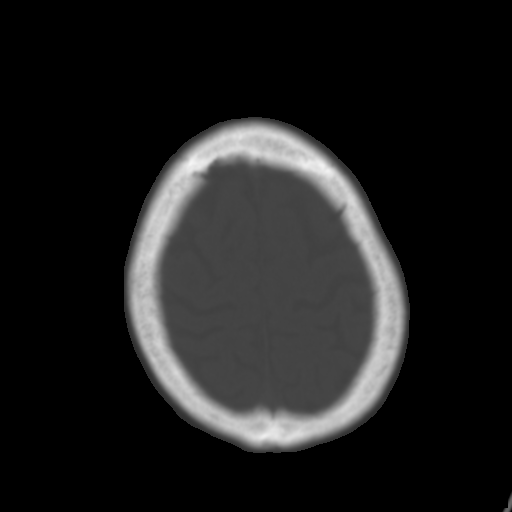
[im 29/36  brain]
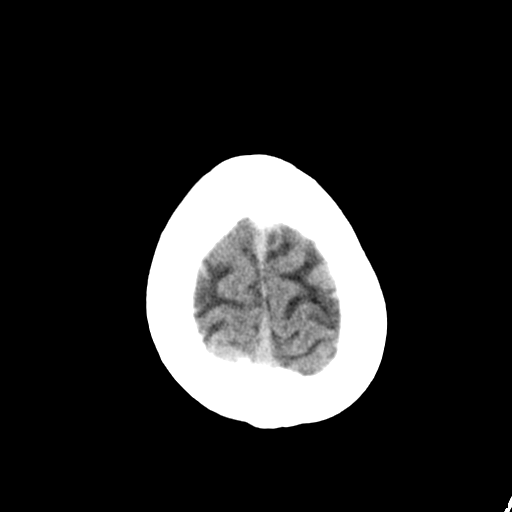
[im 32/36  brain]
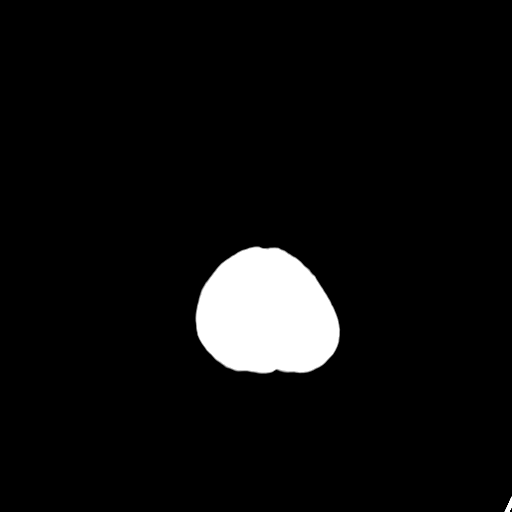
[im 34/36  brain]
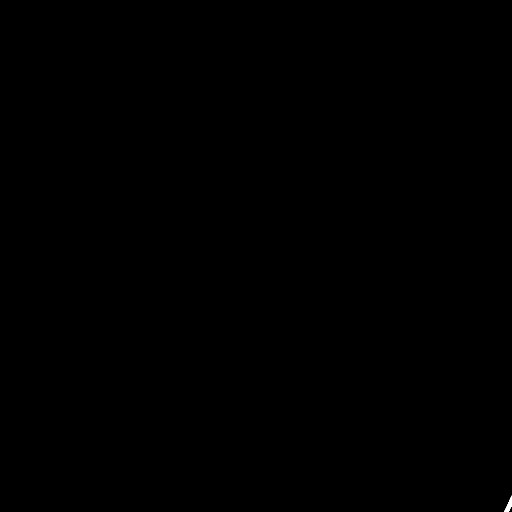

[16 of 30 positions shown; findings below may reference images not displayed]

FINDINGS: No evidence for acute hemorrhage, mass lesion, midline shift,
hydrocephalus or large infarct. Again noted are changes from a
recent left medial orbital wall fracture. No acute bone abnormality.
IMPRESSION: No acute intracranial abnormality.

Known left medial orbital wall fracture.

## 2016-10-19 ENCOUNTER — Other Ambulatory Visit: Payer: Self-pay | Admitting: Pediatrics

## 2016-10-19 DIAGNOSIS — Z1231 Encounter for screening mammogram for malignant neoplasm of breast: Secondary | ICD-10-CM

## 2016-11-12 ENCOUNTER — Other Ambulatory Visit: Payer: Self-pay | Admitting: Pediatrics

## 2016-11-12 ENCOUNTER — Ambulatory Visit
Admission: RE | Admit: 2016-11-12 | Discharge: 2016-11-12 | Disposition: A | Discharge status: Home / Self Care | Payer: Medicare Other | Source: Ambulatory Visit | Attending: Pediatrics | Admitting: Pediatrics

## 2016-11-12 DIAGNOSIS — Z1231 Encounter for screening mammogram for malignant neoplasm of breast: Secondary | ICD-10-CM | POA: Diagnosis present

## 2016-11-18 ENCOUNTER — Other Ambulatory Visit: Payer: Self-pay | Admitting: Pediatrics

## 2016-11-18 DIAGNOSIS — N6489 Other specified disorders of breast: Secondary | ICD-10-CM

## 2016-11-18 DIAGNOSIS — R928 Other abnormal and inconclusive findings on diagnostic imaging of breast: Secondary | ICD-10-CM

## 2016-11-23 ENCOUNTER — Inpatient Hospital Stay
Admission: RE | Admit: 2016-11-23 | Discharge: 2016-11-23 | Disposition: A | Discharge status: Home / Self Care | Payer: Self-pay | Source: Ambulatory Visit | Attending: *Deleted | Admitting: *Deleted

## 2016-11-23 ENCOUNTER — Other Ambulatory Visit: Payer: Self-pay | Admitting: *Deleted

## 2016-11-23 DIAGNOSIS — Z9289 Personal history of other medical treatment: Secondary | ICD-10-CM

## 2016-12-03 ENCOUNTER — Ambulatory Visit
Admission: RE | Admit: 2016-12-03 | Discharge: 2016-12-03 | Disposition: A | Discharge status: Home / Self Care | Payer: Medicare Other | Source: Ambulatory Visit | Attending: Pediatrics | Admitting: Pediatrics

## 2016-12-03 DIAGNOSIS — N6489 Other specified disorders of breast: Secondary | ICD-10-CM

## 2016-12-03 DIAGNOSIS — R928 Other abnormal and inconclusive findings on diagnostic imaging of breast: Secondary | ICD-10-CM | POA: Insufficient documentation

## 2017-11-26 ENCOUNTER — Other Ambulatory Visit: Payer: Self-pay

## 2017-11-26 ENCOUNTER — Emergency Department
Admission: EM | Admit: 2017-11-26 | Discharge: 2017-11-26 | Disposition: A | Discharge status: Home / Self Care | Payer: Medicare Other | Attending: Emergency Medicine | Admitting: Emergency Medicine

## 2017-11-26 DIAGNOSIS — R42 Dizziness and giddiness: Secondary | ICD-10-CM | POA: Insufficient documentation

## 2017-11-26 DIAGNOSIS — Z7982 Long term (current) use of aspirin: Secondary | ICD-10-CM | POA: Insufficient documentation

## 2017-11-26 DIAGNOSIS — Z79899 Other long term (current) drug therapy: Secondary | ICD-10-CM | POA: Diagnosis not present

## 2017-11-26 DIAGNOSIS — Z96651 Presence of right artificial knee joint: Secondary | ICD-10-CM | POA: Insufficient documentation

## 2017-11-26 DIAGNOSIS — E119 Type 2 diabetes mellitus without complications: Secondary | ICD-10-CM | POA: Insufficient documentation

## 2017-11-26 DIAGNOSIS — Z7902 Long term (current) use of antithrombotics/antiplatelets: Secondary | ICD-10-CM | POA: Diagnosis not present

## 2017-11-26 DIAGNOSIS — Z87891 Personal history of nicotine dependence: Secondary | ICD-10-CM | POA: Diagnosis not present

## 2017-11-26 DIAGNOSIS — I10 Essential (primary) hypertension: Secondary | ICD-10-CM | POA: Diagnosis not present

## 2017-11-26 DIAGNOSIS — Z7984 Long term (current) use of oral hypoglycemic drugs: Secondary | ICD-10-CM | POA: Insufficient documentation

## 2017-11-26 LAB — URINALYSIS, COMPLETE (UACMP) WITH MICROSCOPIC
Bacteria, UA: NONE SEEN
Bilirubin Urine: NEGATIVE
GLUCOSE, UA: NEGATIVE mg/dL
Ketones, ur: NEGATIVE mg/dL
Leukocytes, UA: NEGATIVE
NITRITE: NEGATIVE
PH: 5 (ref 5.0–8.0)
PROTEIN: NEGATIVE mg/dL
Specific Gravity, Urine: 1.017 (ref 1.005–1.030)
Squamous Epithelial / LPF: NONE SEEN (ref 0–5)
WBC UA: NONE SEEN WBC/hpf (ref 0–5)

## 2017-11-26 LAB — GLUCOSE, CAPILLARY: Glucose-Capillary: 165 mg/dL — ABNORMAL HIGH (ref 70–99)

## 2017-11-26 LAB — CBC
HCT: 38.7 % (ref 35.0–47.0)
Hemoglobin: 13.5 g/dL (ref 12.0–16.0)
MCH: 30.9 pg (ref 26.0–34.0)
MCHC: 34.8 g/dL (ref 32.0–36.0)
MCV: 88.8 fL (ref 80.0–100.0)
Platelets: 231 10*3/uL (ref 150–440)
RBC: 4.35 MIL/uL (ref 3.80–5.20)
RDW: 14.3 % (ref 11.5–14.5)
WBC: 7.8 10*3/uL (ref 3.6–11.0)

## 2017-11-26 LAB — BASIC METABOLIC PANEL
Anion gap: 12 (ref 5–15)
BUN: 17 mg/dL (ref 8–23)
CALCIUM: 9.1 mg/dL (ref 8.9–10.3)
CO2: 28 mmol/L (ref 22–32)
Chloride: 95 mmol/L — ABNORMAL LOW (ref 98–111)
Creatinine, Ser: 0.93 mg/dL (ref 0.44–1.00)
Glucose, Bld: 168 mg/dL — ABNORMAL HIGH (ref 70–99)
Potassium: 4.8 mmol/L (ref 3.5–5.1)
SODIUM: 135 mmol/L (ref 135–145)

## 2017-11-26 LAB — TROPONIN I: Troponin I: <0.03 ng/mL (ref <0.03)

## 2017-11-26 LAB — MAGNESIUM: Magnesium: 1.9 mg/dL (ref 1.7–2.4)

## 2017-11-26 MED ORDER — SODIUM CHLORIDE 0.9 % IV BOLUS
1000.0000 mL | Freq: Once | INTRAVENOUS | Status: AC
  Administered 2017-11-26: 1000 mL via INTRAVENOUS

## 2017-11-26 MED ORDER — SODIUM CHLORIDE 0.9 % IV BOLUS
1000.0000 mL | Freq: Once | INTRAVENOUS | Status: AC
Start: 2017-11-26 — End: 2017-11-26
  Administered 2017-11-26: 999 mL via INTRAVENOUS

## 2017-11-26 NOTE — ED Provider Notes (Signed)
[facility] Emergency Department Provider Note  ____________________________________________  Time seen: Approximately 9:51 AM  I have reviewed the triage vital signs and the nursing notes.   HISTORY  Chief Complaint Dizziness   HPI Amber Stevenson is a 62 y.o. female with a history of diabetes, hypertension, hypothyroidism, fibromyalgia, depression who presents from her primary care doctor's office for evaluation of dizziness.  Patient reports dizzy spells for 1 week.  She reports feeling lightheaded like she is going to pass out.  Those are worse when she stands up but present when she is sitting down as well.  She has had mild nausea but no vomiting.  She has had a mild diffuse headache with these episodes.  She reports that when she feels like she is going to pass out she develops blurry vision but when she feels better the blurry vision goes away.  No personal or family history of stroke.  She denies facial droop, slurred speech, difficulty finding words, unilateral weakness or numbness, vertigo.  She denies chest pain, palpitations, shortness of breath, URI symptoms, fever, chills, dysuria or hematuria, abdominal pain.  She does report that her sugars have been elevated to the mid 200s in lower 300s for the last 2 months.  She is on oral agents only for her diabetes.  Past Medical History:  Diagnosis Date  . Arthritis   . Depression   . Diabetes mellitus without complication (HCC)   . Fibromyalgia   . Hypertension   . Thyroid disease     There are no active problems to display for this patient.   Past Surgical History:  Procedure Laterality Date  . ABDOMINAL HYSTERECTOMY    . APPENDECTOMY    . HAND SURGERY Right   . REPLACEMENT TOTAL KNEE BILATERAL    . THYROID SURGERY      Prior to Admission medications   Medication Sig Start Date End Date Taking? Authorizing Provider  albuterol (PROVENTIL HFA;VENTOLIN HFA) 108 (90 BASE) MCG/ACT inhaler  Inhale 1 puff into the lungs every 4 (four) hours as needed for wheezing or shortness of breath.   Yes [provider]  albuterol (PROVENTIL) (2.5 MG/3ML) 0.083% nebulizer solution Take 2.5 mg by nebulization every 4 (four) hours as needed for wheezing or shortness of breath.   Yes [provider]  amLODipine (NORVASC) 10 MG tablet Take 10 mg by mouth daily.   Yes [provider]  aspirin EC 81 MG tablet Take 81 mg by mouth daily.   Yes [provider]  budesonide-formoterol (SYMBICORT) 160-4.5 MCG/ACT inhaler Inhale 2 puffs into the lungs 2 (two) times daily.   Yes [provider]  carbamazepine (TEGRETOL XR) 400 MG 12 hr tablet Take 400 mg by mouth daily.    Yes [provider]  Cholecalciferol (VITAMIN D) 2000 units tablet Take 2,000 Units by mouth daily.   Yes [provider]  diazepam (VALIUM) 5 MG tablet Take 2.5 mg by mouth every 12 (twelve) hours as needed for anxiety.    Yes [provider]  DULoxetine (CYMBALTA) 60 MG capsule Take 60 mg by mouth daily.   Yes [provider]  glipiZIDE (GLUCOTROL) 5 MG tablet Take 10 mg by mouth daily before breakfast.   Yes [provider]  HYDROcodone-acetaminophen (NORCO/VICODIN) 5-325 MG per tablet Take 1 tablet by mouth every 8 (eight) hours as needed for moderate pain.   Yes [provider]  levothyroxine (SYNTHROID, LEVOTHROID) 112 MCG tablet Take 112 mcg by mouth  daily.   Yes [provider]  losartan (COZAAR) 50 MG tablet Take 50 mg by mouth daily.   Yes [provider]  metFORMIN (GLUCOPHAGE) 1000 MG tablet Take 1,000 mg by mouth 2 (two) times daily with a meal.   Yes [provider]  methocarbamol (ROBAXIN) 500 MG tablet Take 500 mg by mouth 4 (four) times daily as needed for muscle spasms.    Yes [provider]  omeprazole (PRILOSEC) 20 MG capsule Take 20 mg by mouth daily.   Yes [provider]    pravastatin (PRAVACHOL) 80 MG tablet Take 80 mg by mouth at bedtime.   Yes [provider]  saccharomyces boulardii (FLORASTOR) 250 MG capsule Take 250 mg by mouth 2 (two) times daily.   Yes [provider]    Allergies Augmentin [amoxicillin-pot clavulanate]; Gabapentin; and Lisinopril  Family History  Problem Relation Age of Onset  . Breast cancer Mother 71    Social History Social History   Tobacco Use  . Smoking status: Former Smoker  Substance Use Topics  . Alcohol use: No  . Drug use: No    Review of Systems  Constitutional: Negative for fever. + Lightheadedness Eyes: Negative for visual changes. ENT: Negative for sore throat. Neck: No neck pain  Cardiovascular: Negative for chest pain. Respiratory: Negative for shortness of breath. Gastrointestinal: Negative for abdominal pain, vomiting or diarrhea. Genitourinary: Negative for dysuria. Musculoskeletal: Negative for back pain. Skin: Negative for rash. Neurological: Negative for weakness or numbness. + HA Psych: No SI or HI  ____________________________________________   PHYSICAL EXAM:  VITAL SIGNS: ED Triage Vitals  Enc Vitals Group     BP 11/26/17 0859 (!) 145/53     Pulse Rate 11/26/17 0859 83     Resp 11/26/17 0859 20     Temp 11/26/17 0859 98.6 F (37 C)     Temp Source 11/26/17 0859 Oral     SpO2 11/26/17 0859 98 %     Weight 11/26/17 0901 200 lb (90.7 kg)     Height 11/26/17 0901 5\' 2"  (1.575 m)     Head Circumference --      Peak Flow --      Pain Score 11/26/17 0900 5     Pain Loc --      Pain Edu? --      Excl. in GC? --     Constitutional: Alert and oriented. Well appearing and in no apparent distress. HEENT:      Head: Normocephalic and atraumatic.         Eyes: Conjunctivae are normal. Sclera is non-icteric.       Mouth/Throat: Mucous membranes are moist.       Neck: Supple with no signs of meningismus. Cardiovascular: Regular rate and rhythm. III/VI systolic  murmur loudest at the right upper sternal border. No gallops, or rubs. 2+ symmetrical distal pulses are present in all extremities. No JVD. Respiratory: Normal respiratory effort. Lungs are clear to auscultation bilaterally. No wheezes, crackles, or rhonchi.  Gastrointestinal: Soft, non tender, and non distended with positive bowel sounds. No rebound or guarding. Musculoskeletal: Nontender with normal range of motion in all extremities. No edema, cyanosis, or erythema of extremities. Neurologic: Normal speech and language. A & O x3, PERRL, EOMI, no nystagmus, CN II-XII intact, motor testing reveals good tone and bulk throughout. There is no evidence of pronator drift or dysmetria. Muscle strength is 5/5 throughout.  Sensory examination is intact. Gait normal Skin: Skin is warm,  dry and intact. No rash noted. Psychiatric: Mood and affect are normal. Speech and behavior are normal.  ____________________________________________   LABS (all labs ordered are listed, but only abnormal results are displayed)  Labs Reviewed  BASIC METABOLIC PANEL - Abnormal; Notable for the following components:      Result Value   Chloride 95 (*)    Glucose, Bld 168 (*)    All other components within normal limits  URINALYSIS, COMPLETE (UACMP) WITH MICROSCOPIC - Abnormal; Notable for the following components:   Color, Urine YELLOW (*)    APPearance CLEAR (*)    Hgb urine dipstick SMALL (*)    All other components within normal limits  GLUCOSE, CAPILLARY - Abnormal; Notable for the following components:   Glucose-Capillary 165 (*)    All other components within normal limits  CBC  TROPONIN I  MAGNESIUM  CBG MONITORING, ED   ____________________________________________  EKG  ED ECG REPORT I, Nita Sickle, the attending physician, personally viewed and interpreted this ECG.  Normal sinus rhythm, rate of 85, frequent PACs, normal intervals, normal axis, no ST elevations or depressions, T wave  inversions in inferior lateral leads.  Unchanged from prior ____________________________________________  RADIOLOGY  I have personally reviewed the images performed during this visit and I agree with the Radiologist's read.   Interpretation by Radiologist:  No results found.    ____________________________________________   PROCEDURES  Procedure(s) performed: None Procedures Critical Care performed:  None ____________________________________________   INITIAL IMPRESSION / ASSESSMENT AND PLAN / ED COURSE   62 y.o. female with a history of diabetes, hypertension, hypothyroidism, fibromyalgia, depression who presents from her primary care doctor's office for evaluation of dizziness/lightheadedness for 1 week.  Patient is well-appearing, no distress, she has normal vital signs, she is neurologically intact, she has a systolic murmur which is not new.  Just sitting her up in bed she became extremely lightheaded and felt like she was going to pass out.  Will check orthostatics, EKG to rule out arrhythmias or ischemia, labs to rule out anemia or electrolyte abnormalities, dehydration or AKI.  Will give IV fluids.  Will monitor on telemetry  Clinical Course as of Nov 27 1411  Fri Nov 26, 2017  1146 Patient found to be orthostatic with standing and feeling very dizzy.  After liter fluid vitals improved slightly but still orthostatic. Will give a 2nd bolus and reassess   [CV]  1407 After second bag of fluid patient no longer orthostatic, no longer complaining of dizziness.  At this time she will be discharged home with follow-up with her primary care doctor.  Recommend increase oral hydration.  Discussed return precautions for signs of stroke, chest pain, syncope, dizziness.   [CV]    Clinical Course User Index [CV] Don Perking [place], MD     As part of my medical decision making, I reviewed the following data within the electronic MEDICAL RECORD NUMBER Nursing notes reviewed and  incorporated, Labs reviewed , EKG interpreted , Old EKG reviewed, Old chart reviewed, Notes from prior ED visits and [place] Controlled Substance Database    Pertinent labs & imaging results that were available during my care of the patient were reviewed by me and considered in my medical decision making (see chart for details).    ____________________________________________   FINAL CLINICAL IMPRESSION(S) / ED DIAGNOSES  Final diagnoses:  Orthostatic dizziness      NEW MEDICATIONS STARTED DURING THIS VISIT:  ED Discharge Orders    None  Note:  This document was prepared using Dragon voice recognition software and may include unintentional dictation errors.    Don PerkingVeronese, [place], MD 11/26/17 [phone]

## 2017-11-26 NOTE — ED Triage Notes (Signed)
Pt reports dizziness that has been going on all week, states that she is diabetic and her sugars of been 250-300 for the past 2 months, states that she doesn't feel weak on either side of her body or in her legs. Reports feeling dizzy just sitting in wheelchair. Denies feeling congested

## 2017-11-26 NOTE — ED Notes (Signed)
Pt reports her dizziness is minimal now

## 2019-11-01 ENCOUNTER — Other Ambulatory Visit: Payer: Self-pay | Admitting: Pediatrics

## 2019-11-01 DIAGNOSIS — Z780 Asymptomatic menopausal state: Secondary | ICD-10-CM

## 2021-02-11 ENCOUNTER — Other Ambulatory Visit: Payer: Self-pay

## 2021-02-11 ENCOUNTER — Emergency Department
Admission: EM | Admit: 2021-02-11 | Discharge: 2021-02-11 | Disposition: A | Discharge status: Home / Self Care | Payer: Medicare Other | Attending: Emergency Medicine | Admitting: Emergency Medicine

## 2021-02-11 ENCOUNTER — Emergency Department: Payer: Medicare Other

## 2021-02-11 ENCOUNTER — Encounter: Payer: Self-pay | Admitting: Emergency Medicine

## 2021-02-11 DIAGNOSIS — R000 Tachycardia, unspecified: Secondary | ICD-10-CM | POA: Insufficient documentation

## 2021-02-11 DIAGNOSIS — Z5321 Procedure and treatment not carried out due to patient leaving prior to being seen by health care provider: Secondary | ICD-10-CM | POA: Insufficient documentation

## 2021-02-11 LAB — BASIC METABOLIC PANEL
Anion gap: 9 (ref 5–15)
BUN: 16 mg/dL (ref 8–23)
CO2: 27 mmol/L (ref 22–32)
Calcium: 9.3 mg/dL (ref 8.9–10.3)
Chloride: 102 mmol/L (ref 98–111)
Creatinine, Ser: 0.82 mg/dL (ref 0.44–1.00)
GFR, Estimated: >60 mL/min (ref >60)
Glucose, Bld: 168 mg/dL — ABNORMAL HIGH (ref 70–99)
Potassium: 4.5 mmol/L (ref 3.5–5.1)
Sodium: 138 mmol/L (ref 135–145)

## 2021-02-11 LAB — CBC
HCT: 41.9 % (ref 36.0–46.0)
Hemoglobin: 13.8 g/dL (ref 12.0–15.0)
MCH: 29.7 pg (ref 26.0–34.0)
MCHC: 32.9 g/dL (ref 30.0–36.0)
MCV: 90.3 fL (ref 80.0–100.0)
Platelets: 246 10*3/uL (ref 150–400)
RBC: 4.64 MIL/uL (ref 3.87–5.11)
RDW: 13.6 % (ref 11.5–15.5)
WBC: 8.4 10*3/uL (ref 4.0–10.5)
nRBC: 0 % (ref 0.0–0.2)

## 2021-02-11 LAB — TROPONIN I (HIGH SENSITIVITY): Troponin I (High Sensitivity): 7 ng/L (ref <18)

## 2021-02-11 NOTE — ED Triage Notes (Signed)
Correction Son reports that she was at her Cardiologist appointment and they found that her to be SVT

## 2021-02-11 NOTE — ED Provider Notes (Signed)
Emergency Medicine Provider Triage Evaluation Note  Amber Stevenson , a 65 y.o. female  was evaluated in triage.  Patient was referred to the emergency department from her cardiologist office for SVT.  Patient denied palpitations, chest tightness or chest pain.  Patient states that she was seeing her cardiologist for routine checkup.  Patient denies history of SVT.  She states that she is currently completely asymptomatic at this time.  Patient was given metoprolol prior to being referred to the emergency department.  Review of Systems  Positive:  Negative: No chest pain, chest tightness or abdominal pain.   Physical Exam  BP 130/72 (BP Location: Left Arm)   Pulse 85   Resp 18   SpO2 94%  Gen:   Awake, no distress   Resp:  Normal effort  MSK:   Moves extremities without difficulty  Other:    Medical Decision Making  Medically screening exam initiated at 4:38 PM.  Appropriate orders placed.  Amber Stevenson was informed that the remainder of the evaluation will be completed by another provider, this initial triage assessment does not replace that evaluation, and the importance of remaining in the ED until their evaluation is complete.  Assessment and plan Paroxysmal SVT 65 year old female presents to the emergency department from cardiology with SVT.  SVT on prior EKG visualized.  Patient is in normal sinus rhythm in emergency department. Will await cardiac labs/workup and reassess.    Pia Mau [place], PA-C 02/11/21 1643    Gilles Chiquito, MD 02/11/21 1721

## 2021-02-11 NOTE — ED Notes (Signed)
Pt called for room multiple times, attempted to call pt's cell phone with no answer.

## 2021-02-11 NOTE — ED Triage Notes (Signed)
Pt was at [facility] for a regular appointment and they found that her heart rate was elevated. They gave her some Metoprolol and waited 30 minutes it did not help her heart rate so they sent her here by POV. Pt denies any CP or SHOB besides her normal COPD.
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
## 2021-02-12 LAB — TSH: TSH: 0.677 u[IU]/mL (ref 0.350–4.500)

## 2021-12-20 IMAGING — CR DG CHEST 2V
1 series · 2 of 2 positions shown · non-contrast
Comparison: 04/17/2014

CLINICAL DATA: Tachycardia.

EXAM:
CHEST - 2 VIEW

[Series 1: dg chest 2 view · 0.14mm/px · 2 of 2 slices shown]
[im 1/2]
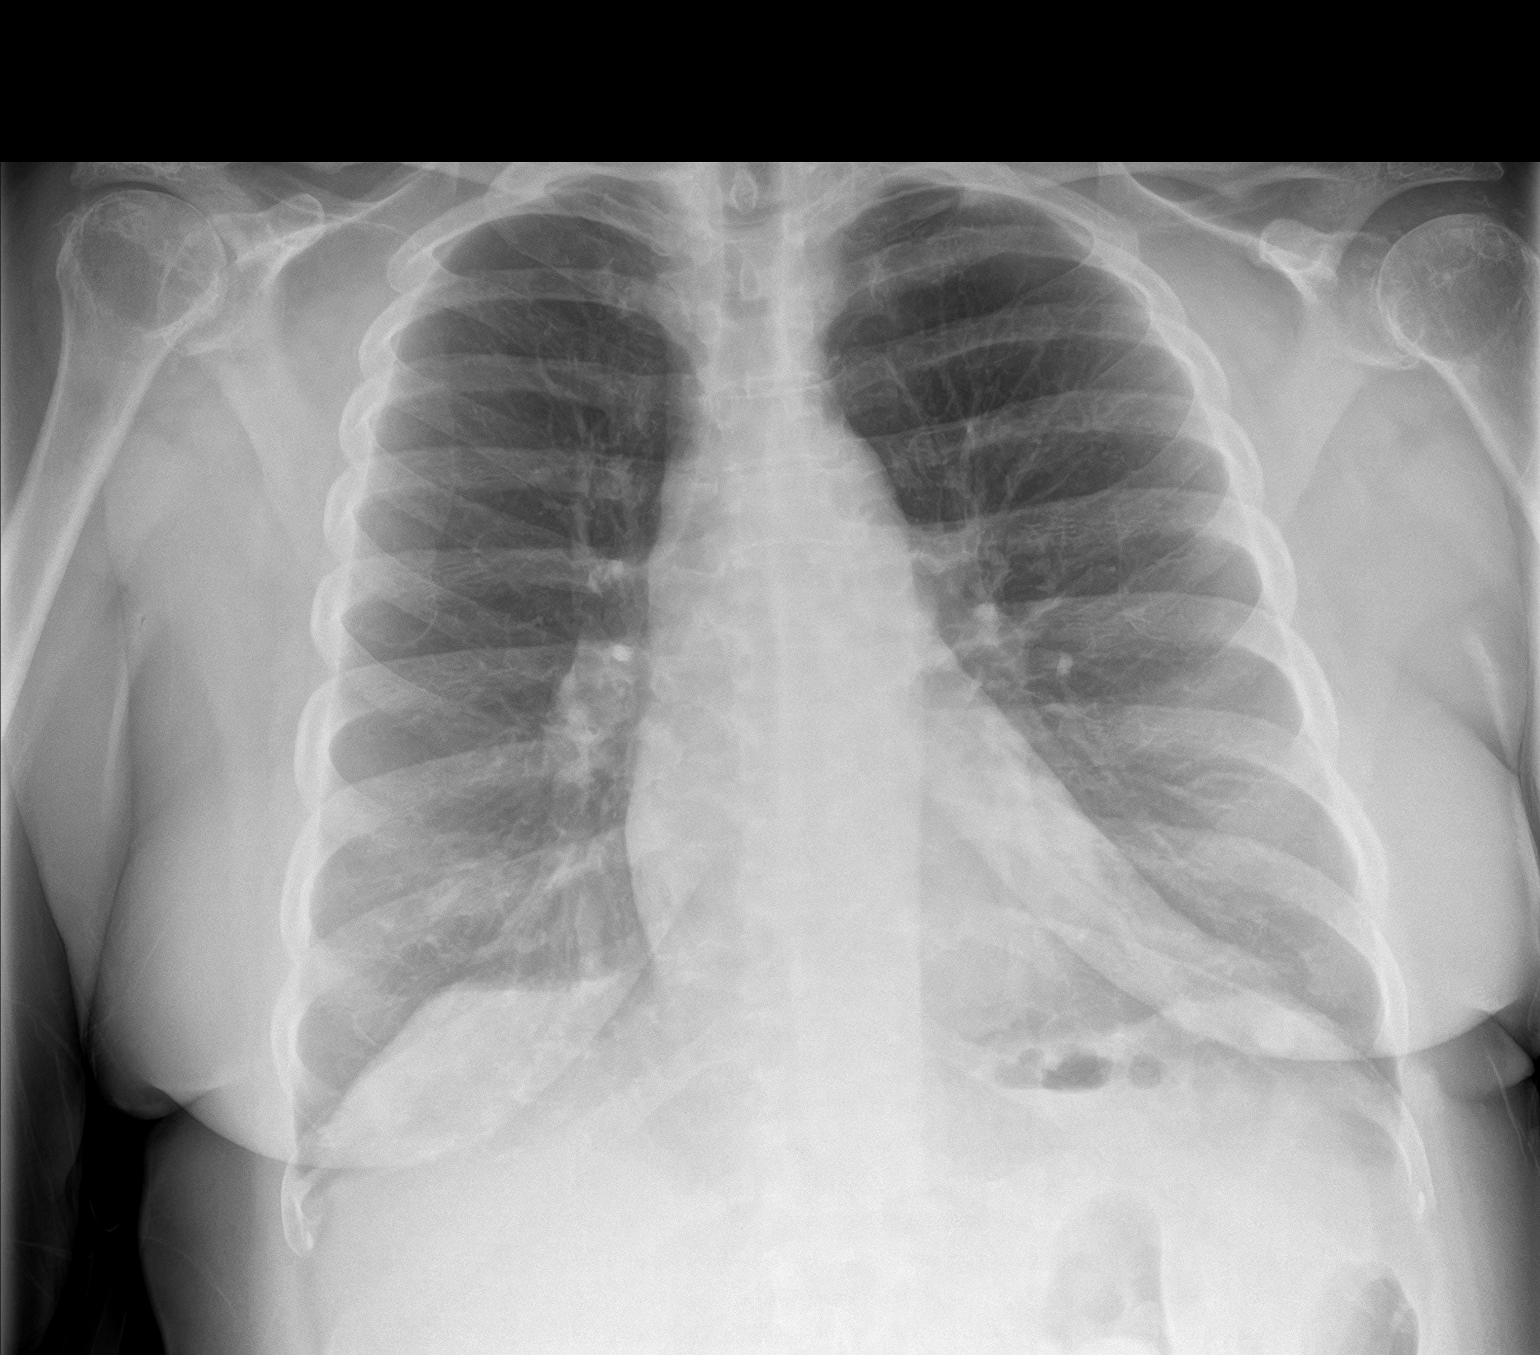
[im 2/2]
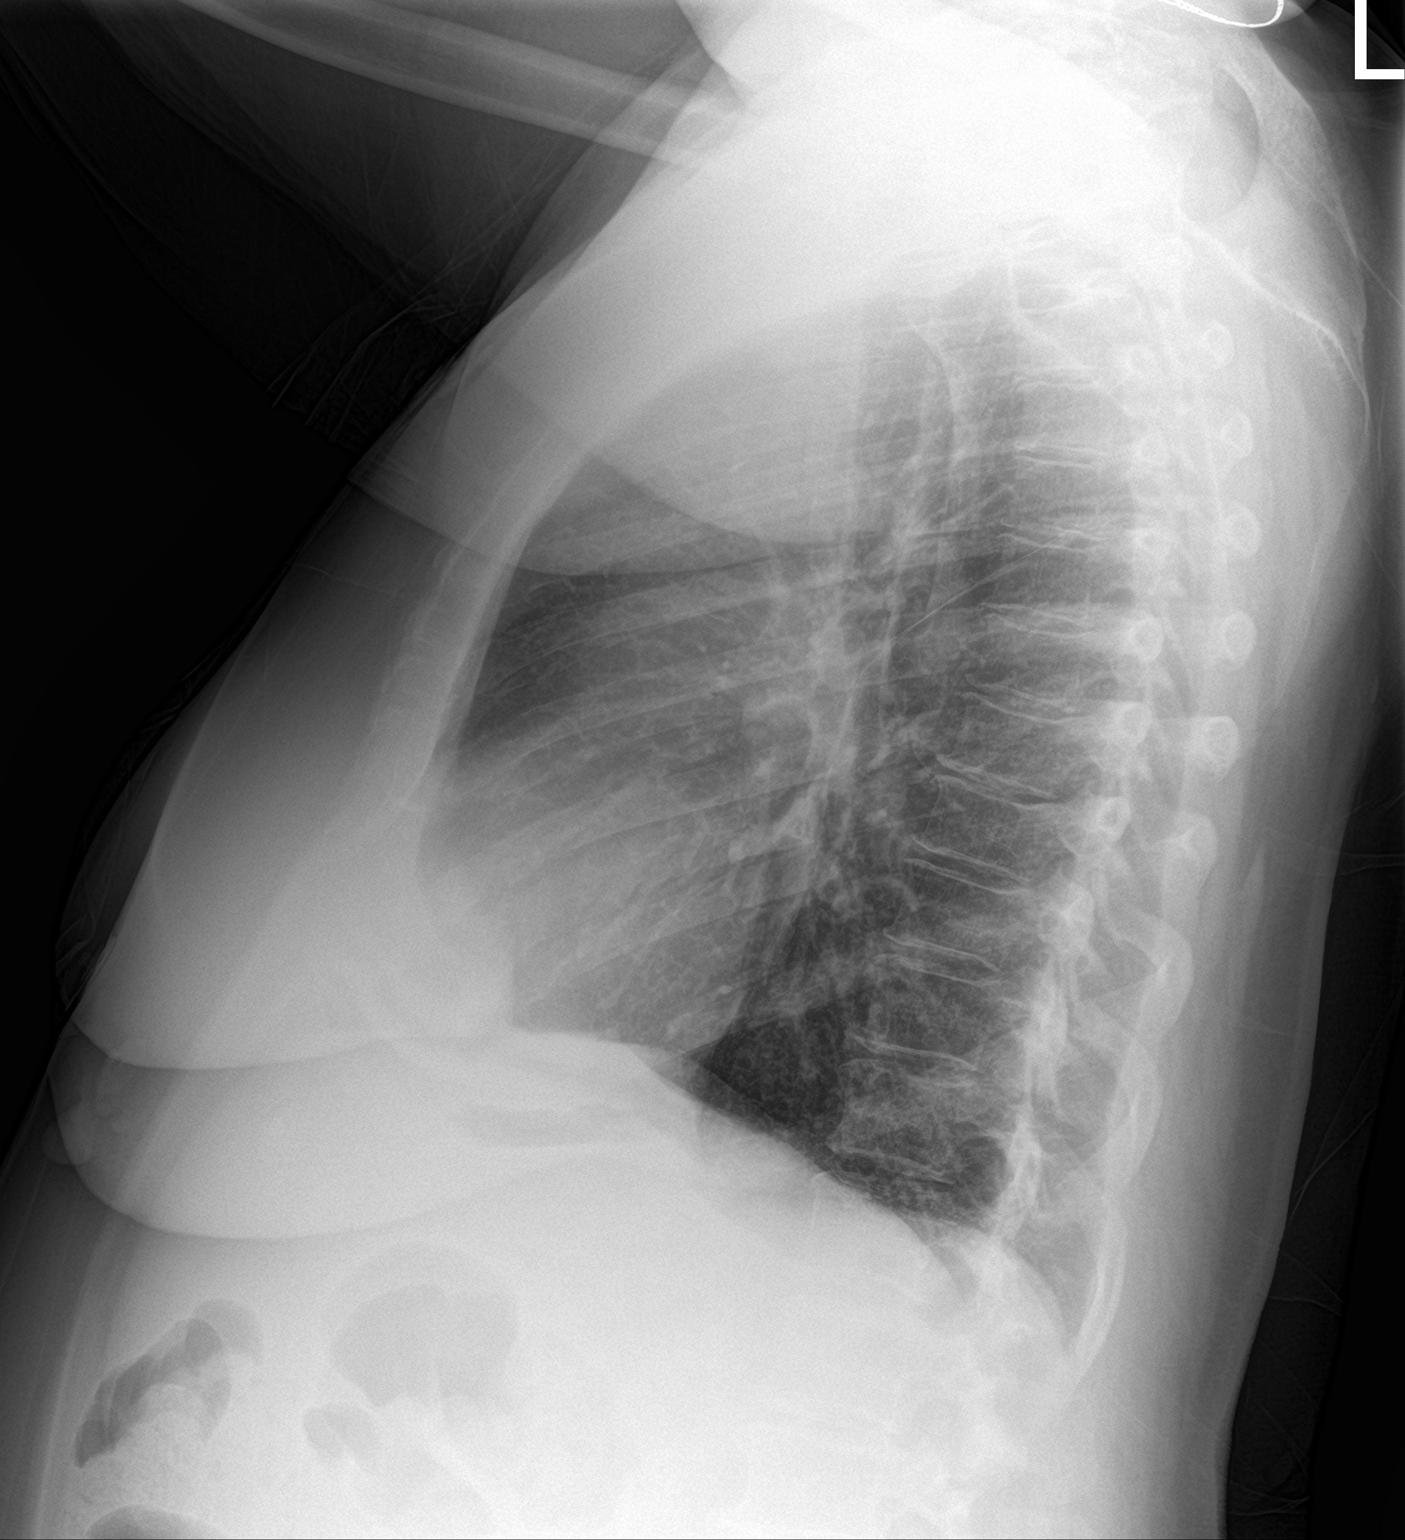

[2 of 2 positions shown; findings below may reference images not displayed]

FINDINGS: The cardiomediastinal silhouette is within normal limits. The lungs
are well inflated. There is slight accentuation of the interstitial
markings in the lower lungs without evidence of overt edema,
segmental airspace consolidation, pleural effusion, or pneumothorax.
No acute osseous abnormality is seen.
IMPRESSION: No active cardiopulmonary disease.

## 2023-05-20 NOTE — Progress Notes (Signed)
 [facility] Chief Complaint:   Chief Complaint  Patient presents with   Diabetes   Hypertension   Subjective:   Amber Stevenson is a 68 y.o. female in today for diabetes mellitus, hypertension, hyperlipidemia.  History of Present Illness Amber Stevenson is a 68 year old female with bipolar disorder, diabetes, and fibromyalgia who presents for a follow-up visit.  In January, she experienced back pain accompanied by muscle spasms, which was managed with daily Valium  for eight days. She found relief through muscle exercises and using an electric blanket. During this time, she experienced constipation, naproxen use so stopped using that medication but has since resumed regular bowel movements. Additionally, she developed a boil in the buttock area, which she managed with hot water baths.  Has chronic low back pain.  Her fibromyalgia symptoms have worsened due to cold weather, causing pain in her buttocks, thighs, arms, and hands. She previously used naproxen but discontinued it due to constipation. She continues to take Cymbalta  for pain management.  She has a history of bipolar disorder with mood fluctuations, described as 'up during the day and down at night,' but currently feels stable with the use of Tegretol . No recent smoking. Her reflux is well-controlled with medication.  Her diabetes management includes Jardiance  10 mg, glipizide  10 mg twice daily, and metformin 1000 mg twice daily. She maintains a diet of three small meals a day and reports increased hunger during winter.   She adheres to her thyroid  medication regimen, taking it first thing in the morning on an empty stomach, and uses a pill organizer to ensure compliance with all medications.  She reports improved breathing with regular use of Symbicort, two puffs twice daily, and has not needed albuterol  for a long time. No recent illness or episodes of supraventricular tachycardia (SVT).  She has mild aortic  insufficiency with a leaky and stiff valve. No worsening leg swelling or episodes of SVT.  Her white blood cell count was slightly elevated on the last test, a pattern in the past, with no recent illness.  Low magnesium  historically.  Takes half pill daily. Magnesium   Date Value Ref Range Status  08/25/2018 1.8 1.8 - 2.5 mg/dL Final  98/83/7981 2.0 1.8 - 2.5 mg/dL Final  91/95/7983 2.1 1.8 - 2.5 mg/dL Final  93/93/7986 1.9 1.8 - 2.5 mg/dL Final   Had high potassium at last lab draw.  Denies palpitations. Lab Results  Component Value Date   K 5.2 (H) 11/25/2022   Anxiety/depression and bipolar 1: patient lost her 81 yr old daughter in June 2020 presumptive accidental overdose of narcotics. She has not seen a therapist. She has support from her other daughter, son, and other immediate family. Feeling a little better and notes some SAD.  Taking Valium  very rarely.  Signed benzo contract 05/20/2023  but not using often.  About 2 times month usually Taking Tegretol  400 mg as mood stabilizer and has for many years and has not increased dose.   Hypothyroidism follow-up:  Taking medications as prescribed? Yes Number of pills missed in the last month? 0 Taking medication fasting?  yes Symptoms suggestive of hyper or hypothyroidism? Denies unexplained fatigue, weight changes, heat/cold intolerance, bowel/skin changes Lab Results  Component Value Date   TSH 4.52 11/25/2022   Chronic pain in back and diffusely from fibromyalgia.  Aleve as needed and tried sons naproxen 500 mg bid but causes constipation.  Hand swelling and pain that sometimes keeps her from doing things due to  arthritis. Tried capsaicin cream which did not help much. Also taking cymbalta .  Lab Results  Component Value Date   CREATININE 1.3 (H) 11/25/2022   Last GFR Lab Results  Component Value Date   GFR 45 11/25/2022   Chronic renal insufficiency. Avoids nonsteroidal anti-inflammatory medications as much as possible and  does not take Aleve every day. Drinking some water. Lab Results  Component Value Date   CREATININE 1.3 (H) 11/25/2022   CREATININE 1.2 (H) 05/29/2022   CREATININE 1.0 11/26/2021   Last three GFR values Lab Results  Component Value Date   GFR 45 11/25/2022   GFR 49 05/29/2022   GFR 61 11/26/2021    COPD and breathing better with twice daily dosing Symbicort not feeling shortness of breath. N not needed albuterol  in many months.  No asthma attack since stopped smoking.  Quit 2015.  Hyperlipidemia: Follow-Up: Pt feels this problem is: controlled Current symptoms: none Patient denies exertional chest pain, dyspnea, palpitations, syncope, orthopnea, edema, and or paroxysmal nocturnal dyspnea  Current medication: Rosuvastatin  40 mg Medication side effects? none  The 10-year ASCVD risk score (Arnett DK, et al., 2019) is: 18.9%   Values used to calculate the score:     Age: 74 years     Sex: Female     Is Non-Hispanic African American: No     Diabetic: Yes     Tobacco smoker: No     Systolic Blood Pressure: 128 mmHg     Is BP treated: Yes     HDL Cholesterol: 78 mg/dL     Total Cholesterol: 187 mg/dL   Lipid:  Lab Results  Component Value Date   CHOLTOTAL 187 11/25/2022   CHOLTOTAL 161 05/29/2022   CHOLTOTAL 159 05/14/2021   Lab Results  Component Value Date   HDL 78 11/25/2022   HDL 63 05/29/2022   HDL 58 05/14/2021   Lab Results  Component Value Date   LDLCALC 78 11/25/2022   LDLCALC 76 05/29/2022   LDLCALC 77 05/14/2021   Lab Results  Component Value Date   TRIG 153 11/25/2022   TRIG 112 05/29/2022   TRIG 121 05/14/2021    Not screened for lung cancer and declines screening.   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)   High risk medication use   Stage 3b chronic kidney disease (CMS/HHS-HCC)   Nonrheumatic aortic valve stenosis   Nonrheumatic aortic valve insufficiency   SOB (shortness of breath) on exertion   Obesity (BMI 30.0-34.9)   Essential hypertension   Dyslipidemia associated with type 2 diabetes mellitus  (CMS/HHS-HCC)   History of supraventricular tachycardia    Outpatient Medications Marked as Taking for the 05/20/23 encounter (Office Visit) with Delfina Darice Nice, MD  Medication Sig Dispense Refill   amLODIPine  (NORVASC ) 10 MG tablet TAKE (1) TABLET BY MOUTH EVERY DAY 100 tablet 2   aspirin  81 MG EC tablet Take 81 mg by mouth daily.     blood glucose diagnostic (ACCU-CHEK AVIVA PLUS TEST STRP) test strip Use once daily 100 each 6   carBAMazepine  (TEGRETOL  XR) 400 MG XR tablet Take 1 tablet (400 mg total) by mouth every 12 (twelve)  hours 180 tablet 3   cholecalciferol  (VITAMIN D3) 2,000 unit tablet Take 2,000 Units by mouth once daily.     diazePAM  (VALIUM ) 5 MG tablet TAKE 0.5 TABLETS BY MOUTH EVERY 8 HOURS AS NEEDED FOR ANXIETY 25 tablet 0   DULoxetine  (CYMBALTA ) 60 MG DR capsule Take 1 capsule (60 mg total) by mouth once daily 90 capsule 3   glipiZIDE  (GLUCOTROL ) 5 MG tablet TAKE (2) TABLETS BY MOUTH TWICE DAILY BEFORE MEALS. 120 tablet 7   JARDIANCE  25 mg tablet TAKE (1) TABLET BY MOUTH EVERY DAY WITH BREAKFAST 30 tablet 11   levothyroxine  (SYNTHROID ) 112 MCG tablet TAKE (1) TABLET BY MOUTH EVERY DAY 90 tablet 1   losartan  (COZAAR ) 50 MG tablet TAKE (1) TABLET BY MOUTH EVERY DAY 100 tablet 2   metFORMIN (GLUCOPHAGE) 1000 MG tablet Take 1 tablet (1,000 mg total) by mouth 2 (two) times daily with meals 180 tablet 3   naproxen (EC NAPROSYN) 500 MG EC tablet Take 1 tablet (500 mg total) by mouth 2 (two) times daily with meals 60 tablet 5   omeprazole (PRILOSEC) 20  MG DR capsule TAKE (1) CAPSULE BY MOUTH EVERY DAY 90 capsule 3   PROAIR  HFA 90 mcg/actuation inhaler INHALE 1 PUFF INTO THE LUNGS EVERY 4 HOURS AS NEEDED FOR WHEEZING 8.5 g 11   rosuvastatin  (CRESTOR ) 40 MG tablet Take 1 tablet (40 mg total) by mouth once daily 100 tablet 3   Saccharomyces boulardii (FLORASTOR) 250 mg capsule Take 250 mg by mouth 2 (two) times daily.     SYMBICORT 160-4.5 mcg/actuation inhaler Inhale 2 inhalations into the lungs 2 (two) times daily 10.2 g 12    Allergies  Allergen Reactions   Augmentin [Amoxicillin-Pot Clavulanate] Other (See Comments)    Other Reaction: GI UPSET   Gabapentin Other (See Comments)    High doses led to falls   Lisinopril Other (See Comments)    HYPERKALEMIA    Current medications, allergies, problem list, PMH, PSH and SH personally reviewed in EPIC today.  ROS:  See HPI   Objective:   Vitals:   05/20/23 0929  BP: 128/71  Pulse: 108  Resp: 16  Weight: 81.6 kg (179 lb 12.8 oz)  PainSc:   6  PainLoc: Generalized     Body mass index is 32.88 kg/m.  Constit- Well-appearing, no distress, alert Eyes- anicteric, PERRL, sclera clear, lids normal Neck- Supple, trachea midline, shotty cervical lymphadenopathy Cor- Regular rate and rhythm, normal S1S2, 3 out of 6 systolic ejection murmur heard best at right upper sternal border Lungs-clear to auscultation, no resp distress   Extr- No pretibial or pedal edema bilaterally  Psych-alert and oriented 3, normal affect and mood but tearful at times   Assessment/Plan:     Assessment & Plan Fibromyalgia Chronic widespread musculoskeletal pain, exacerbated by cold weather. Reports pain in buttocks, thighs, arms, and hands. Previously managed with naproxen, but discontinued due to constipation. Discussed restarting naproxen with added fiber supplementation to prevent constipation. - Restart naproxen with added fiber supplementation (Metamucil or Benefiber) - Continue Cymbalta  for  pain management  Diabetes Mellitus Type 2 On a regimen of Jardiance  10 mg, glipizide  10 mg twice a day, and metformin 1000 mg twice a day. Reports adherence to medication and dietary regimen. Eating three meals a day, small amounts, and feeling hungry three times a day. - Continue current diabetes medications (Jardiance , glipizide , metformin) - Monitor blood glucose levels regularly  Chronic Kidney Disease Kidney function has declined with age  but no significant recent changes. Previous labs showed slightly elevated potassium and normal magnesium  levels. Discussed rechecking kidney function, magnesium , and potassium levels today. - Recheck kidney function, magnesium , and potassium levels today  Aortic Insufficiency Mild aortic insufficiency with aortic valve stiffness. No recent episodes of SVT or worsening leg swelling. Discussed monitoring for symptoms of worsening aortic insufficiency or SVT. - Monitor for symptoms of worsening aortic insufficiency or SVT  Chronic Obstructive Pulmonary Disease (COPD) Breathing has improved with regular use of Symbicort. No recent use of albuterol . Discussed continued use of Symbicort and monitoring for respiratory symptoms. - Continue Symbicort 2 puffs twice a day - Monitor for respiratory symptoms  Bipolar Disorder Experiences mood fluctuations but is currently well-managed on Tegretol . Unclear if diagnosed with Bipolar I or II. Discussed mood stability and medication adherence. - Continue Tegretol  as prescribed  Gastroesophageal Reflux Disease (GERD) Symptoms well-controlled with current medication regimen. Reports good symptom control as long as she takes her pills. - Continue current GERD medications  Hypothyroidism Adherent to thyroid  medication regimen, taking it first thing in the morning on an empty stomach. No missed doses over the last month. - Continue current thyroid  medication regimen  General Health Maintenance Well-organized with  medication management, using a pill organizer and adhering to a consistent schedule. Ensures adequate hydration. - Encourage continued adherence to medication regimen - Ensure adequate hydration  Follow-up - Recheck white blood cell count - Consider referral to hematologist if white blood cell count remains elevated - Schedule follow-up appointment after lab results are available. This note has been created using automated tools and reviewed for accuracy by KAREN JUTTA BEHLING. 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      Follow-up in 6 months  Future Appointments     Date/Time Provider Department Center Visit Type   12/01/2023 11:00 AM (Arrive by 10:45 AM) POPULATION HEALTH NURSE - [facility] WELLNESS   12/01/2023 11:40 AM (Arrive by 11:25 AM) Delfina Darice Nice, MD [facility]  [facility] OFFICE VISIT       There are no Patient Instructions on file for this visit.
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
## 2023-12-01 NOTE — Progress Notes (Signed)
 [facility] Chief Complaint:   Chief Complaint  Patient presents with   Hypertension   Subjective:   Amber Stevenson is a 68 y.o. female in today for diabetes mellitus, hypertension, hyperlipidemia.  History of Present Illness Amber Stevenson is a 68 year old female with fibromyalgia and COPD who presents for a follow-up visit.  Her fibromyalgia symptoms, particularly back pain and spasms, have improved and now occur intermittently, often influenced by cold weather. Cold weather exacerbates her fibromyalgia symptoms, causing significant pain in her hands, which improves with warmth. She takes Naprosyn, half a pill in the morning and half in the evening, which helps with her hand pain but causes constipation. Cymbalta  aids in joint pain and mood stabilization.  She has a history of bipolar disorder, for which she takes Tegretol  as a mood stabilizer. She is unsure of her specific bipolar type but acknowledges the medication's effectiveness.  Her COPD is managed with Symbicort, which she uses twice daily, and she has not needed her albuterol  rescue inhaler. She quit smoking in 2015 and celebrates a ten-year anniversary of being smoke-free. She reports a mild cold, possibly due to seasonal allergies.  She takes rosuvastatin  at bedtime for cholesterol management and is due for a refill in October. Her thyroid  medication is taken every morning, followed by magnesium  two hours later. She has a system for organizing her medications, with morning, nighttime, and two-hour medications separated.  She has a history of supraventricular tachycardia (SVT) but reports no recent episodes. No leg swelling now and has never experienced.  Her gastroesophageal reflux disease (GERD) is controlled with Prilosec, and she reports no recent issues.  Her diabetes management includes metformin 1000 mg twice daily, Jardiance  25 mg once daily, and glipizide  5 mg two tablets twice daily. Does not check  glucoses.  Goal is less than 8.0. refuses injectable meds.    She is actively involved in family activities, including a monthly game night with her daughter and granddaughter, where she plays Rummy Cube. She practices the game daily with her granddaughter, who often wins.  Low magnesium  historically.  Takes half pill daily. Magnesium   Date Value Ref Range Status  05/20/2023 2.0 1.8 - 2.5 mg/dL Final  94/78/7979 1.8 1.8 - 2.5 mg/dL Final  98/83/7981 2.0 1.8 - 2.5 mg/dL Final  93/93/7986 1.9 1.8 - 2.5 mg/dL Final   Had high potassium at last lab draw.  Denies palpitations. Lab Results  Component Value Date   K 4.9 05/20/2023    Hypothyroidism follow-up:  Taking medications as prescribed? Yes Number of pills missed in the last month? 0 Taking medication fasting?  yes Symptoms suggestive of hyper or hypothyroidism? Denies unexplained fatigue, weight changes, heat/cold intolerance, bowel/skin changes Lab Results  Component Value Date   TSH 5.85 (H) 05/20/2023    Lab Results  Component Value Date   CREATININE 1.3 (H) 05/20/2023   Last GFR Lab Results  Component Value Date   GFR 45 05/20/2023   Chronic renal insufficiency. Avoids nonsteroidal anti-inflammatory medications as much as possible and does not take naproxen every day. Drinking some water. Lab Results  Component Value Date   CREATININE 1.3 (H) 05/20/2023   CREATININE 1.3 (H) 11/25/2022   CREATININE 1.2 (H) 05/29/2022   Last three GFR values Lab Results  Component Value Date   GFR 45 05/20/2023   GFR 45 11/25/2022   GFR 49 05/29/2022    Hyperlipidemia: Follow-Up: Pt feels this problem is: controlled Current symptoms: none Patient denies  exertional chest pain, dyspnea, palpitations, syncope, orthopnea, edema, and or paroxysmal nocturnal dyspnea  Current medication: Rosuvastatin  40 mg Medication side effects? none  The 10-year ASCVD risk score (Arnett DK, et al., 2019) is: 18%   Values used to calculate  the score:     Age: 50 years     Clincally relevant sex: Female     Is Non-Hispanic African American: No     Diabetic: Yes     Tobacco smoker: No     Systolic Blood Pressure: 117 mmHg     Is BP treated: Yes     HDL Cholesterol: 78 mg/dL     Total Cholesterol: 187 mg/dL   Lipid:  Lab Results  Component Value Date   CHOLTOTAL 187 11/25/2022   CHOLTOTAL 161 05/29/2022   CHOLTOTAL 159 05/14/2021   Lab Results  Component Value Date   HDL 78 11/25/2022   HDL 63 05/29/2022   HDL 58 05/14/2021   Lab Results  Component Value Date   LDLCALC 78 11/25/2022   LDLCALC 76 05/29/2022   LDLCALC 77 05/14/2021   Lab Results  Component Value Date   TRIG 153 11/25/2022   TRIG 112 05/29/2022   TRIG 121 05/14/2021   Lab Results  Component Value Date   HGBA1C 8.0 (H) 05/20/2023   HGBA1C 8.0 (H) 11/25/2022    Not screened for lung cancer and declines screening.   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)   High risk medication use   Stage 3b chronic kidney disease (CMS/HHS-HCC)   Nonrheumatic aortic valve stenosis   Nonrheumatic aortic valve insufficiency   SOB (shortness of breath) on exertion   Obesity (BMI 30.0-34.9)   Essential hypertension   Dyslipidemia associated with type 2 diabetes mellitus (CMS/HHS-HCC)   History of supraventricular tachycardia    Outpatient Medications Marked as Taking for the 12/01/23 encounter (Office Visit) with Delfina Darice Nice, MD  Medication Sig Dispense Refill   amLODIPine  (NORVASC ) 10 MG tablet TAKE (1) TABLET BY MOUTH EVERY DAY 100 tablet 2   aspirin  81 MG EC tablet Take 81 mg by mouth  daily.     carBAMazepine  (TEGRETOL  XR) 400 MG XR tablet Take 1 tablet (400 mg total) by mouth every 12 (twelve) hours 180 tablet 3   cholecalciferol  (VITAMIN D3) 2,000 unit tablet Take 2,000 Units by mouth once daily.     diazePAM  (VALIUM ) 5 MG tablet TAKE ONE HALF (1/2) TABLET BY MOUTH EVERY 8 HOURS AS NEEDED FOR ANXIETY 25 tablet 1   DULoxetine  (CYMBALTA ) 60 MG DR capsule Take 1 capsule (60 mg total) by mouth once daily 90 capsule 1   glipiZIDE  (GLUCOTROL ) 5 MG tablet Take 2 tablets (10 mg total) by mouth 2 (two) times daily before meals 400 tablet 3   JARDIANCE  25 mg tablet TAKE (1) TABLET BY MOUTH EVERY DAY WITH BREAKFAST 30 tablet 5   levothyroxine  (SYNTHROID ) 112 MCG tablet TAKE (1) TABLET BY MOUTH EVERY DAY 100 tablet 0   losartan  (COZAAR ) 50 MG tablet TAKE (1) TABLET BY MOUTH EVERY DAY 100 tablet 2   metFORMIN (GLUCOPHAGE) 1000 MG tablet Take 1 tablet (1,000 mg total) by mouth 2 (  two) times daily with meals 200 tablet 3   omeprazole (PRILOSEC) 20 MG DR capsule Take 1 capsule (20 mg total) by mouth once daily 90 capsule 1   PROAIR  HFA 90 mcg/actuation inhaler INHALE 1 PUFF INTO THE LUNGS EVERY 4 HOURS AS NEEDED FOR WHEEZING 8.5 g 11   rosuvastatin  (CRESTOR ) 40 MG tablet Take 1 tablet (40 mg total) by mouth once daily 100 tablet 3   Saccharomyces boulardii (FLORASTOR) 250 mg capsule Take 250 mg by mouth 2 (two) times daily.     SYMBICORT 160-4.5 mcg/actuation inhaler Inhale 2 inhalations into the lungs 2 (two) times daily 10.2 g 12   [DISCONTINUED] SYMBICORT 160-4.5 mcg/actuation inhaler Inhale 2 inhalations into the lungs 2 (two) times daily 10.2 g 12    Allergies  Allergen Reactions   Augmentin [Amoxicillin-Pot Clavulanate] Other (See Comments)    Other Reaction: GI UPSET   Gabapentin Other (See Comments)    High doses led to falls   Lisinopril Other (See Comments)    HYPERKALEMIA    Current medications, allergies, problem list, PMH, PSH and SH personally  reviewed in EPIC today.  ROS:  See HPI   Objective:   Vitals:   12/01/23 1125  BP: 117/65  Pulse: 87  Weight: 82.6 kg (182 lb 1.6 oz)  Height: 157.5 cm (5' 2.01)  PainSc:   4  PainLoc: Generalized     Body mass index is 33.3 kg/m.  Constit- Well-appearing, no distress, alert Eyes- anicteric, PERRL, sclera clear, lids normal Neck- Supple, trachea midline, shotty cervical lymphadenopathy Cor- Regular rate and rhythm, normal S1S2, 3 out of 6 systolic ejection murmur heard best at right upper sternal border Lungs-clear to auscultation, no resp distress   Extr- No pretibial or pedal edema bilaterally  Psych-alert and oriented 3, normal affect and mood but tearful at times   Assessment/Plan:     Assessment & Plan  Type 2 diabetes mellitus with unspecified complications Diabetes management is ongoing with a previous hemoglobin A1c of 8% but would be optimal at less than 7.4%.  Consider rebylsus if not at goal.  Refer to clinical pharmacist if not at goal - Continue metformin 1000 mg twice daily with meals - Continue Jardiance  25 mg once daily with breakfast - Continue glipizide  5 mg twice daily before meals - Monitor hemoglobin A1c  Essential hypertension Blood pressure management is ongoing with amlodipine  and losartan . - Continue amlodipine  10 mg daily - Continue losartan  50 mg daily  Fibromyalgia Symptoms are exacerbated by cold weather. Current management includes duloxetine  and naproxen for joint and hand pain. - Continue duloxetine  60 mg daily - Continue naproxen 500 mg twice daily with meals  Chronic kidney disease stage 3b Avoid NSAIDs as much as possible.  Drink water  Nonrheumatic aortic valve insufficiency No current symptoms of heart issues or leg swelling indicating worsening valve insufficiency.  Chronic obstructive pulmonary disease (COPD) COPD management is well-controlled with Symbicort. She has not needed the albuterol  rescue inhaler and quit  smoking 10 years ago. - Continue Symbicort 160-4.5 mcg/actuation inhalation twice daily - Continue Proair  HFA 90 mcg/actuation inhalation as needed  Bipolar disorder Mood is stable with carbamazepine  and duloxetine . - Continue carbamazepine  400 mg every 12 hours - Continue duloxetine  60 mg daily  Gastroesophageal reflux disease (GERD) without esophagitis GERD symptoms are well-controlled with omeprazole. - Continue omeprazole 20 mg daily  Hypothyroidism Hypothyroidism is managed with levothyroxine  taken in the morning. - Continue levothyroxine  112 mcg daily  Mixed hyperlipidemia associated with  type 2 diabetes mellitus Hyperlipidemia is managed with rosuvastatin , taken at bedtime. Refill needed soon. - Continue rosuvastatin  40 mg daily - Refill rosuvastatin  prescription  Hypomagnesemia Hypomagnesemia is managed with magnesium  supplementation taken two hours after other medications. - Continue magnesium  supplementation This note has been created using automated tools and reviewed for accuracy by KAREN JUTTA BEHLING. 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) -     Hemoglobin A1C; Future -     Lipid Panel W/Reflex Direct Low Density Lipoprotein (LDL) Cholesterol; Future -     Albumin/Creatinine Ratio, Random Urine; Future  Nonrheumatic aortic valve insufficiency  Nonrheumatic aortic valve stenosis  Bipolar 1 disorder (CMS/HHS-HCC)  Chronic obstructive pulmonary disease, unspecified COPD  type (CMS/HHS-HCC) -     SYMBICORT 160-4.5 mcg/actuation inhaler; Inhale 2 inhalations into the lungs 2 (two) times daily  History of supraventricular tachycardia  Obesity (BMI 30.0-34.9)  Dyslipidemia associated with type 2 diabetes mellitus (CMS/HHS-HCC)  Gastroesophageal reflux disease without esophagitis  Fibromyalgia   This office note has been partially dictated using Dragon natural speak so please forgive any typographic errors.      Follow-up in 6 months  Future Appointments     Date/Time Provider Department Center Visit Type   06/01/2024 10:00 AM (Arrive by 9:45 AM) Delfina Darice Nice, MD [facility] OFFICE VISIT   12/07/2024 10:00 AM (Arrive by 9:45 AM) POPULATION HEALTH NURSE - [facility] WELL[facility]   12/07/2024 11:00 AM (Arrive by 10:45 AM) Delfina Darice Nice, MD [facility] OFFICE VISIT       There are no Patient Instructions on file for this visit.

## 2023-12-08 NOTE — Telephone Encounter (Signed)
 Let try it.  I sent prescription to your pharmacy on file.  Please follow instructions that would come with the pill about how to take it correctly.  Plan is to increase the dose to 7 mg after 30 days if you tolerate this dose well.

## 2024-04-12 NOTE — Progress Notes (Signed)
 " Chief Complaint  Patient presents with   Wheezing    Has copd yesterday it was really bad and today was progressively worse     Subjective  Amber Stevenson is a 69 y.o. female who presents for Wheezing (Has copd yesterday it was really bad and today was progressively worse/) HPI History of Present Illness Amber Stevenson is a 69 year old female with COPD who presents with a flare-up of symptoms.  She experienced a COPD flare-up this morning, characterized by gasping for breath while walking from room to room. She uses Symbicort twice daily. She has a history of smoking but quit ten years ago. No one in her family smokes, and she lives in a smoke-free house.  She experienced a bad cold in December, lasting two weeks, with symptoms of coughing and nasal congestion, which she believes triggered the current flare-up. She was not tested for the flu and managed the cold at home.  She has previously been on oxygen therapy but does not currently have oxygen at home. Her oxygen levels drop when she walks around. She has a nebulizer machine at home, but the albuterol  medication for it expired in 2017. She inquires about the usefulness of having albuterol  at home for episodes.  Her past medical history includes diabetes, for which she is on Jardiance . She previously tried Ozempic but discontinued it due to severe stomach upset.  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     blood glucose diagnostic (ACCU-CHEK AVIVA PLUS TEST STRP) test strip Use once daily 100 each 6   carBAMazepine  (TEGRETOL  XR) 400 MG XR tablet Take 1 tablet (400 mg total) by mouth every 12 (twelve) hours 180 tablet 3   cholecalciferol  (VITAMIN D3) 2,000 unit tablet Take 2,000 Units by mouth once daily.     diazePAM  (VALIUM ) 5 MG tablet TAKE 1/2 TABLET BY MOUTH EVERY 8 HOURS AS NEEDED FOR ANXIETY 25 tablet 1   DULoxetine  (CYMBALTA ) 60 MG DR capsule Take 1 capsule (60 mg total) by mouth once daily 90 capsule 1   glipiZIDE  (GLUCOTROL ) 5 MG tablet Take 2 tablets (10 mg total) by mouth 2 (two) times daily before meals 400 tablet 3   JARDIANCE  25 mg tablet TAKE (1) TABLET BY MOUTH  EVERY DAY WITH BREAKFAST 30 tablet 5   levothyroxine  (SYNTHROID ) 112 MCG tablet Take 1 tablet (112 mcg total) by mouth once daily 100 tablet 3   losartan  (COZAAR ) 50 MG tablet TAKE (1) TABLET BY MOUTH EVERY DAY 100 tablet 2   metFORMIN (GLUCOPHAGE) 1000 MG tablet Take 1 tablet (1,000 mg total) by mouth 2 (two) times daily with meals 200 tablet 3   omeprazole (PRILOSEC) 20 MG DR capsule Take 1 capsule (20 mg total) by mouth once daily 90 capsule 1   PROAIR  HFA 90 mcg/actuation inhaler INHALE 1 PUFF INTO THE LUNGS EVERY 4 HOURS AS NEEDED FOR WHEEZING 8.5 g 11    rosuvastatin  (CRESTOR ) 40 MG tablet Take 1 tablet (40 mg total) by mouth once daily 100 tablet 3   Saccharomyces boulardii (FLORASTOR) 250 mg capsule Take 250 mg by mouth 2 (two) times daily.     semaglutide (OZEMPIC) 0.25 mg or 0.5 mg (2 mg/3 mL) pen injector Inject 0.375 mLs (0.25 mg total) subcutaneously once a week 1.5 mL 1   SYMBICORT 160-4.5 mcg/actuation inhaler Inhale 2 inhalations into the lungs 2 (two) times daily 10.2 g 12   No facility-administered medications prior to visit.      Objective  Vitals:   04/12/24 1130  BP: 117/73  Pulse: (!) 148  Temp: 36.8 C (98.3 F)  TempSrc: Oral  SpO2: 97%  Weight: 82.6 kg (182 lb)  PainSc:   4  PainLoc: Generalized   Body mass index is 33.28 kg/m.  Home Vitals:     Physical Exam Physical Exam CHEST: Decreased air exchange.  Constitutional: alert, obese, acute distress, and communicates well Eye exam: sclera anicteric. Neck: supple, no thyroid  enlargement or cervical adenopathy, and no bruits heard Respiratory: Expiratory wheezing bilaterally but not rales or rhonchi Cardiovascular: regular rate and rhythm and without murmurs, rubs or gallops Lower extremities: lower extremity edema minimal Neurological: Sitting on a wheelchair  Results Labs HbA1c: 8%     Assessment/Plan:   Assessment & Plan Acute exacerbation of chronic obstructive pulmonary disease (COPD) Acute exacerbation likely triggered by recent upper respiratory infection. No current smoking history. Oxygen levels decrease with exertion but are well-managed at rest. No current home oxygen use. Previous oxygen use noted. No fever reported. Recent cold with cough and nasal symptoms. No flu testing performed. - Prescribed 12-day taper of prednisone .  She was told this will increase her glucose levels and to use her medications properly as well as improve her dietary habits. - Prescribed doxycycline  for COPD exacerbation. - Provided albuterol  nebulizer for  use with Symbicort. - Advised monitoring of oxygen levels at home and to return if levels do not improve post-treatment for potential oxygen prescription.  Type 2 diabetes mellitus Recent HbA1c of 8%. Currently on Jardiance . Previous use of Ozempic discontinued due to gastrointestinal side effects. Prednisone  use may elevate blood glucose levels. - Advised monitoring blood glucose levels closely during prednisone  treatment. - Counseled on dietary modifications to avoid sweets, carbohydrates, and breads during prednisone  treatment. Diagnoses and all orders for this visit:  Acute exacerbation of chronic obstructive pulmonary disease (COPD) (CMS/HHS-HCC) -     predniSONE  (DELTASONE ) 10 MG tablet; 3 tabs 2x daily days 1, 2, 3;  2 tabs 2x daily days 4, 5, 6;  1 tab 2x daily days 7, 8, 9;  half tab 2x daily days 10,  11 and 12 -     doxycycline  (VIBRAMYCIN ) 100 MG capsule; Take 1 capsule (100 mg total) by mouth 2 (  two) times daily for 10 days -     albuterol  (PROVENTIL ) 2.5 mg /3 mL (0.083 %) nebulizer solution; Take 3 mLs (2.5 mg total) by nebulization every 6 (six) hours as needed for Wheezing or Shortness of Breath for up to 14 days  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  To follow-up with her PCP.  Bipolar 1 disorder (CMS/HHS-HCC) Symptoms are stable.  Followed by a specialist.  Dyslipidemia associated with type 2 diabetes mellitus (CMS/HHS-HCC) Stable.  To follow-up with PCP.  Stage 3b chronic kidney disease (CMS-HCC) Symptoms are stable.  Followed by a specialist.    This visit was coded based on medical decision making (MDM).           Future Appointments     Date/Time Provider Department Center Visit Type   06/01/2024 10:00 AM (Arrive by 9:45 AM) Delfina Darice Nice, MD [facility] OFFICE VISIT   12/07/2024 10:00 AM (Arrive by 9:45 AM) POPULATION HEALTH NURSE - [facility] [place]  [facility]   12/07/2024 11:00 AM (Arrive by 10:45 AM) Delfina Darice Nice, MD [facility] OFFICE VISIT       Patient Instructions  Diagnoses and all orders for this visit:  Acute exacerbation of chronic obstructive pulmonary disease (COPD) (CMS/HHS-HCC) -     predniSONE  (DELTASONE ) 10 MG tablet; 3 tabs 2x daily days 1, 2, 3;  2 tabs 2x daily days 4, 5, 6;  1 tab 2x daily days 7, 8, 9;  half tab 2x daily days 10,  11 and 12 -     doxycycline  (VIBRAMYCIN ) 100 MG capsule; Take 1 capsule (100 mg total) by mouth 2 (two) times daily for 10 days -     albuterol  (PROVENTIL ) 2.5 mg /3 mL (0.083 %) nebulizer solution; Take 3 mLs (2.5 mg total) by nebulization every 6 (six) hours as needed for Wheezing or Shortness of Breath for up to 14 days     An after visit summary was provided for the patient either in written format (printed) or through [facility].  This note has been created using automated tools and reviewed for accuracy by MARIO E OLMEDO.  "

## 2024-04-15 ENCOUNTER — Inpatient Hospital Stay
Admission: EM | Admit: 2024-04-15 | Discharge: 2024-04-21 | DRG: 291 | Disposition: A | Discharge status: Home / Self Care | Attending: Internal Medicine | Admitting: Internal Medicine

## 2024-04-15 ENCOUNTER — Other Ambulatory Visit: Payer: Self-pay

## 2024-04-15 ENCOUNTER — Emergency Department

## 2024-04-15 DIAGNOSIS — M797 Fibromyalgia: Secondary | ICD-10-CM | POA: Diagnosis present

## 2024-04-15 DIAGNOSIS — N1831 Chronic kidney disease, stage 3a: Secondary | ICD-10-CM | POA: Diagnosis present

## 2024-04-15 DIAGNOSIS — Z1152 Encounter for screening for COVID-19: Secondary | ICD-10-CM

## 2024-04-15 DIAGNOSIS — J9601 Acute respiratory failure with hypoxia: Secondary | ICD-10-CM | POA: Diagnosis present

## 2024-04-15 DIAGNOSIS — Z7982 Long term (current) use of aspirin: Secondary | ICD-10-CM

## 2024-04-15 DIAGNOSIS — J440 Chronic obstructive pulmonary disease with acute lower respiratory infection: Secondary | ICD-10-CM | POA: Diagnosis present

## 2024-04-15 DIAGNOSIS — E1122 Type 2 diabetes mellitus with diabetic chronic kidney disease: Secondary | ICD-10-CM | POA: Diagnosis present

## 2024-04-15 DIAGNOSIS — Z7901 Long term (current) use of anticoagulants: Secondary | ICD-10-CM

## 2024-04-15 DIAGNOSIS — I4891 Unspecified atrial fibrillation: Principal | ICD-10-CM | POA: Diagnosis present

## 2024-04-15 DIAGNOSIS — F319 Bipolar disorder, unspecified: Secondary | ICD-10-CM | POA: Diagnosis present

## 2024-04-15 DIAGNOSIS — Z7951 Long term (current) use of inhaled steroids: Secondary | ICD-10-CM

## 2024-04-15 DIAGNOSIS — Z96653 Presence of artificial knee joint, bilateral: Secondary | ICD-10-CM | POA: Diagnosis present

## 2024-04-15 DIAGNOSIS — K219 Gastro-esophageal reflux disease without esophagitis: Secondary | ICD-10-CM | POA: Diagnosis present

## 2024-04-15 DIAGNOSIS — Z79890 Hormone replacement therapy (postmenopausal): Secondary | ICD-10-CM

## 2024-04-15 DIAGNOSIS — I2489 Other forms of acute ischemic heart disease: Secondary | ICD-10-CM | POA: Diagnosis present

## 2024-04-15 DIAGNOSIS — Z79899 Other long term (current) drug therapy: Secondary | ICD-10-CM

## 2024-04-15 DIAGNOSIS — I429 Cardiomyopathy, unspecified: Secondary | ICD-10-CM | POA: Diagnosis present

## 2024-04-15 DIAGNOSIS — Z6834 Body mass index (BMI) 34.0-34.9, adult: Secondary | ICD-10-CM

## 2024-04-15 DIAGNOSIS — E872 Acidosis, unspecified: Secondary | ICD-10-CM | POA: Diagnosis present

## 2024-04-15 DIAGNOSIS — F419 Anxiety disorder, unspecified: Secondary | ICD-10-CM | POA: Diagnosis present

## 2024-04-15 DIAGNOSIS — I130 Hypertensive heart and chronic kidney disease with heart failure and stage 1 through stage 4 chronic kidney disease, or unspecified chronic kidney disease: Principal | ICD-10-CM | POA: Diagnosis present

## 2024-04-15 DIAGNOSIS — E1165 Type 2 diabetes mellitus with hyperglycemia: Secondary | ICD-10-CM | POA: Diagnosis present

## 2024-04-15 DIAGNOSIS — I5023 Acute on chronic systolic (congestive) heart failure: Secondary | ICD-10-CM | POA: Diagnosis present

## 2024-04-15 DIAGNOSIS — T380X5A Adverse effect of glucocorticoids and synthetic analogues, initial encounter: Secondary | ICD-10-CM | POA: Diagnosis present

## 2024-04-15 DIAGNOSIS — I509 Heart failure, unspecified: Secondary | ICD-10-CM

## 2024-04-15 DIAGNOSIS — E785 Hyperlipidemia, unspecified: Secondary | ICD-10-CM | POA: Diagnosis present

## 2024-04-15 DIAGNOSIS — M1990 Unspecified osteoarthritis, unspecified site: Secondary | ICD-10-CM | POA: Diagnosis present

## 2024-04-15 DIAGNOSIS — J441 Chronic obstructive pulmonary disease with (acute) exacerbation: Secondary | ICD-10-CM | POA: Diagnosis present

## 2024-04-15 DIAGNOSIS — E875 Hyperkalemia: Secondary | ICD-10-CM | POA: Diagnosis present

## 2024-04-15 DIAGNOSIS — J189 Pneumonia, unspecified organism: Secondary | ICD-10-CM | POA: Diagnosis present

## 2024-04-15 DIAGNOSIS — Z7984 Long term (current) use of oral hypoglycemic drugs: Secondary | ICD-10-CM

## 2024-04-15 DIAGNOSIS — I447 Left bundle-branch block, unspecified: Secondary | ICD-10-CM | POA: Diagnosis present

## 2024-04-15 DIAGNOSIS — N2581 Secondary hyperparathyroidism of renal origin: Secondary | ICD-10-CM | POA: Diagnosis present

## 2024-04-15 DIAGNOSIS — E039 Hypothyroidism, unspecified: Secondary | ICD-10-CM | POA: Diagnosis present

## 2024-04-15 DIAGNOSIS — J9801 Acute bronchospasm: Secondary | ICD-10-CM

## 2024-04-15 DIAGNOSIS — R7989 Other specified abnormal findings of blood chemistry: Secondary | ICD-10-CM

## 2024-04-15 DIAGNOSIS — Z803 Family history of malignant neoplasm of breast: Secondary | ICD-10-CM

## 2024-04-15 DIAGNOSIS — N179 Acute kidney failure, unspecified: Secondary | ICD-10-CM | POA: Diagnosis present

## 2024-04-15 DIAGNOSIS — D631 Anemia in chronic kidney disease: Secondary | ICD-10-CM | POA: Diagnosis present

## 2024-04-15 DIAGNOSIS — E66811 Obesity, class 1: Secondary | ICD-10-CM | POA: Diagnosis present

## 2024-04-15 LAB — RESP PANEL BY RT-PCR (RSV, FLU A&B, COVID)  RVPGX2
Influenza A by PCR: NEGATIVE
Influenza B by PCR: NEGATIVE
Resp Syncytial Virus by PCR: NEGATIVE
SARS Coronavirus 2 by RT PCR: NEGATIVE

## 2024-04-15 LAB — CBC WITH DIFFERENTIAL/PLATELET
Abs Immature Granulocytes: 0.09 K/uL — ABNORMAL HIGH (ref 0.00–0.07)
Basophils Absolute: 0 K/uL (ref 0.0–0.1)
Basophils Relative: 0 %
Eosinophils Absolute: 0 K/uL (ref 0.0–0.5)
Eosinophils Relative: 0 %
HCT: 35.3 % — ABNORMAL LOW (ref 36.0–46.0)
Hemoglobin: 11.1 g/dL — ABNORMAL LOW (ref 12.0–15.0)
Immature Granulocytes: 1 %
Lymphocytes Relative: 26 %
Lymphs Abs: 3.8 K/uL (ref 0.7–4.0)
MCH: 29 pg (ref 26.0–34.0)
MCHC: 31.4 g/dL (ref 30.0–36.0)
MCV: 92.2 fL (ref 80.0–100.0)
Monocytes Absolute: 0.8 K/uL (ref 0.1–1.0)
Monocytes Relative: 6 %
Neutro Abs: 9.9 K/uL — ABNORMAL HIGH (ref 1.7–7.7)
Neutrophils Relative %: 67 %
Platelets: 350 K/uL (ref 150–400)
RBC: 3.83 MIL/uL — ABNORMAL LOW (ref 3.87–5.11)
RDW: 15.4 % (ref 11.5–15.5)
WBC: 14.6 K/uL — ABNORMAL HIGH (ref 4.0–10.5)
nRBC: 0 % (ref 0.0–0.2)

## 2024-04-15 LAB — LACTIC ACID, PLASMA: Lactic Acid, Venous: 4 mmol/L (ref 0.5–1.9)

## 2024-04-15 LAB — BLOOD GAS, VENOUS
Acid-base deficit: 5.2 mmol/L — ABNORMAL HIGH (ref 0.0–2.0)
Bicarbonate: 21.6 mmol/L (ref 20.0–28.0)
O2 Saturation: 50.1 %
Patient temperature: 37
pCO2, Ven: 46 mmHg (ref 44–60)
pH, Ven: 7.28 (ref 7.25–7.43)
pO2, Ven: 36 mmHg (ref 32–45)

## 2024-04-15 LAB — PROCALCITONIN: Procalcitonin: 0.1 ng/mL

## 2024-04-15 MED ORDER — HEPARIN (PORCINE) 25000 UT/250ML-% IV SOLN
900.0000 [IU]/h | INTRAVENOUS | Status: DC
  Administered 2024-04-16: 900 [IU]/h via INTRAVENOUS
  Filled 2024-04-15 (×3): qty 250

## 2024-04-15 MED ORDER — DILTIAZEM HCL-DEXTROSE 125-5 MG/125ML-% IV SOLN (PREMIX)
5.0000 mg/h | INTRAVENOUS | Status: DC
  Administered 2024-04-15: 5 mg/h via INTRAVENOUS
  Administered 2024-04-15: 15 mg/h via INTRAVENOUS
  Administered 2024-04-16: 5 mg/h via INTRAVENOUS
  Filled 2024-04-15 (×4): qty 125

## 2024-04-15 MED ORDER — ASPIRIN 81 MG PO CHEW
324.0000 mg | CHEWABLE_TABLET | Freq: Once | ORAL | Status: AC
  Administered 2024-04-15: 324 mg via ORAL
  Filled 2024-04-15: qty 4

## 2024-04-15 MED ORDER — SODIUM CHLORIDE 0.9 % IV BOLUS
250.0000 mL | Freq: Once | INTRAVENOUS | Status: AC
  Administered 2024-04-15: 250 mL via INTRAVENOUS

## 2024-04-15 MED ORDER — HEPARIN BOLUS VIA INFUSION
4000.0000 [IU] | Freq: Once | INTRAVENOUS | Status: AC
  Administered 2024-04-16: 4000 [IU] via INTRAVENOUS
  Filled 2024-04-15: qty 4000

## 2024-04-15 MED ORDER — SODIUM CHLORIDE 0.9 % IV SOLN
100.0000 mg | Freq: Once | INTRAVENOUS | Status: AC
  Administered 2024-04-16: 100 mg via INTRAVENOUS
  Filled 2024-04-15: qty 100

## 2024-04-15 MED ORDER — SODIUM CHLORIDE 0.9 % IV SOLN
1.0000 g | Freq: Once | INTRAVENOUS | Status: AC
  Administered 2024-04-16: 1 g via INTRAVENOUS
  Filled 2024-04-15: qty 10

## 2024-04-15 NOTE — ED Provider Notes (Signed)
 "  [facility] Provider Note    Event Date/Time   First MD Initiated Contact with Patient 04/15/24 2211     (approximate)   History   Shortness of Breath   HPI  Amber Stevenson is a 69 y.o. female with a past medical history of COPD,  bipolar 1, fibromyalgia, type 2 diabetes SVT, hypertension aortic valve insufficiency who presents to the emergency department with 1 week of progressively worsening shortness of breath.  Patient was treated earlier this week with steroids and an antibiotic (patient cannot recall which one).  Her shortness of breath acutely worsened today.  Patient was satting 86% on room air and received 3 DuoNebs, and albuterol  neb, Solu-Medrol  125 mg IV en route.  She was noted by EMS to be in atrial fibrillation with rapid ventricular rate and was also given 10 mg of diltiazem .  Patient denies any previous history of atrial fibrillation.  Patient denies any chest pain abdominal pain changes in urinary or bowel habits.  No SI HI or AVH S          Physical Exam   Triage Vital Signs: ED Triage Vitals  Encounter Vitals Group     BP      Girls Systolic BP Percentile      Girls Diastolic BP Percentile      Boys Systolic BP Percentile      Boys Diastolic BP Percentile      Pulse      Resp      Temp      Temp src      SpO2      Weight      Height      Head Circumference      Peak Flow      Pain Score      Pain Loc      Pain Education      Exclude from Growth Chart     Most recent vital signs: Vitals:   04/15/24 2251 04/15/24 2341  BP: 118/60 120/64  Pulse: (!) 141 (!) 123  Resp: 20 (!) 21  Temp:  98.2 F (36.8 C)  SpO2: 93% 93%    Nursing Triage Note reviewed. Vital signs reviewed and patients oxygen saturation is normoxic  General: Patient is well nourished, well developed, awake and alert, resting comfortably in no acute distress Head: Normocephalic and atraumatic Eyes: Normal inspection, extraocular muscles intact, no  conjunctival pallor Ear, nose, throat: Normal external exam Neck: Normal range of motion Respiratory: Patient is in no respiratory distress, lungs very mild bronchospasm Cardiovascular: Patient is tachycardic, RR GI: Abd SNT with no guarding or rebound  Back: Normal inspection of the back with good strength and range of motion throughout all ext Extremities: pulses intact with good cap refills, no LE pitting edema or calf tenderness Neuro: The patient is alert and oriented to person, place, and time, appropriately conversive, with 5/5 bilat UE/LE strength, no gross motor or sensory defects noted. Coordination appears to be adequate. Skin: Warm, dry, and intact Psych: normal mood and affect, no SI or HI  ED Results / Procedures / Treatments   Labs (all labs ordered are listed, but only abnormal results are displayed) Labs Reviewed  CBC WITH DIFFERENTIAL/PLATELET - Abnormal; Notable for the following components:      Result Value   WBC 14.6 (*)    RBC 3.83 (*)    Hemoglobin 11.1 (*)    HCT 35.3 (*)    Neutro Abs 9.9 (*)  Abs Immature Granulocytes 0.09 (*)    All other components within normal limits  COMPREHENSIVE METABOLIC PANEL WITH GFR - Abnormal; Notable for the following components:   Sodium 132 (*)    Potassium 5.3 (*)    Chloride 97 (*)    CO2 19 (*)    Glucose, Bld 305 (*)    BUN 47 (*)    Creatinine, Ser 1.30 (*)    GFR, Estimated 44 (*)    Anion gap 17 (*)    All other components within normal limits  TSH - Abnormal; Notable for the following components:   TSH 5.150 (*)    All other components within normal limits  BLOOD GAS, VENOUS - Abnormal; Notable for the following components:   Acid-base deficit 5.2 (*)    All other components within normal limits  PRO BRAIN NATRIURETIC PEPTIDE - Abnormal; Notable for the following components:   Pro Brain Natriuretic Peptide 6,481.0 (*)    All other components within normal limits  LACTIC ACID, PLASMA - Abnormal; Notable  for the following components:   Lactic Acid, Venous 4.0 (*)    All other components within normal limits  TROPONIN T, HIGH SENSITIVITY - Abnormal; Notable for the following components:   Troponin T High Sensitivity 22 (*)    All other components within normal limits  RESP PANEL BY RT-PCR (RSV, FLU A&B, COVID)  RVPGX2  CULTURE, BLOOD (ROUTINE X 2)  CULTURE, BLOOD (ROUTINE X 2)  T4, FREE  MAGNESIUM   PROCALCITONIN  APTT  PROTIME-INR  TROPONIN T, HIGH SENSITIVITY     EKG EKG and rhythm strip are interpreted by myself:   EKG: [A-fib at heart rate of 50, right QRS duration, QTc 531, nonspecific ST segments and T waves no ectopy EKG not consistent with Acute STEMI Rhythm strip: A-fib in lead II   RADIOLOGY Chest x-ray: Pleural edema with infiltrates on my independent review interpretation radiologist agrees    PROCEDURES:  Critical Care performed: Yes, see critical care procedure note(s)  .Critical Care  Performed by: Nicholaus Rolland BRAVO, MD Authorized by: Nicholaus Rolland BRAVO, MD   Critical care provider statement:    Critical care time (minutes):  35   Critical care was necessary to treat or prevent imminent or life-threatening deterioration of the following conditions:  Circulatory failure   Critical care was time spent personally by me on the following activities:  Development of treatment plan with patient or surrogate, discussions with consultants, evaluation of patient's response to treatment, examination of patient, ordering and review of laboratory studies, ordering and review of radiographic studies, ordering and performing treatments and interventions, pulse oximetry, re-evaluation of patient's condition and review of old charts   Care discussed with: admitting provider   Comments:     A-fib with RVR requiring diltiazem  drip and heparin  drip with bolus    MEDICATIONS ORDERED IN ED: Medications  diltiazem  (CARDIZEM ) 125 mg in dextrose  5% 125 mL (1 mg/mL) infusion (15  mg/hr Intravenous New Bag/Given 04/15/24 2331)  heparin  ADULT infusion 100 units/mL (25000 units/250mL) (900 Units/hr Intravenous New Bag/Given 04/16/24 0018)  cefTRIAXone  (ROCEPHIN ) 1 g in sodium chloride  0.9 % 100 mL IVPB (1 g Intravenous New Bag/Given 04/16/24 0012)  doxycycline  (VIBRAMYCIN ) 100 mg in sodium chloride  0.9 % 250 mL IVPB (has no administration in time range)  sodium chloride  0.9 % bolus 250 mL (0 mLs Intravenous Stopped 04/15/24 2259)  aspirin  chewable tablet 324 mg (324 mg Oral Given 04/15/24 2227)  heparin  bolus via infusion 4,000  Units (4,000 Units Intravenous Bolus from Bag 04/16/24 0021)     IMPRESSION / MDM / ASSESSMENT AND PLAN / ED COURSE                                Differential diagnosis includes, but is not limited to, arrhythmia, pneumonia, bronchospasm, electrolyte derangement, sepsis, CHF exacerbation  ED course: Patient presents acutely but is actually satting well on room air and only has mild evidence of bronchospasm after EMS interventions.  EKG however demonstrates atrial fibrillation with rapid ventricular rate with a new LBBB.  Patient denies any chest pain.  Her last EKG was more than 3 years ago and patient was lost to cardiology follow-up since then.  Given this her case was discussed with on-call cardiology who does agree that this looks more like A-fib with aberrancy than SVT.  He agrees with diltiazem  drip and anticoagulation with heparin  bolus and drip.  Troponin not significantly elevated but BNP is.  Her lactic acid was elevated which may be secondary to rate but technically she does meet sepsis criteria.  Decision made to treat for community-acquired pneumonia was made.  Case discussed with hospitalist for admission   Clinical Course as of 04/16/24 0039  Sat Apr 15, 2024  2319 Cardiology paged [HD]  2329 Discussed with Dr. Florencio with cardiology who recommends anticoagulation as he thinks this is A-fib with aberrancy.  Will initiate heparin  bolus  with drip.  He will consult on her in the AM [HD]  2357 Lactic Acid, Venous(!!): 4.0 [HD]  2357 WBC(!): 14.6 [HD]  2357 Technically meets sepsis criteria and we will give her oral antibiotics for respiratory [HD]    Clinical Course User Index [HD] Nicholaus Rolland BRAVO, MD   -- Risk: 5 This patient has a high risk of morbidity due to further diagnostic testing or treatment. Rationale: This patients evaluation and management involve a high risk of morbidity due to the potential severity of presenting symptoms, need for diagnostic testing, and/or initiation of treatment that may require close monitoring. The differential includes conditions with potential for significant deterioration or requiring escalation of care. Treatment decisions in the ED, including medication administration, procedural interventions, or disposition planning, reflect this level of risk. COPA: 5 The patient has the following acute or chronic illness/injury that poses a possible threat to life or bodily function: [X] : The patient has a potentially serious acute condition or an acute exacerbation of a chronic illness requiring urgent evaluation and management in the Emergency Department. The clinical presentation necessitates immediate consideration of life-threatening or function-threatening diagnoses, even if they are ultimately ruled out.   FINAL CLINICAL IMPRESSION(S) / ED DIAGNOSES   Final diagnoses:  Atrial fibrillation with rapid ventricular response (HCC)  Left bundle branch block  Bronchospasm  Congestive heart failure, unspecified HF chronicity, unspecified heart failure type (HCC)  TSH elevation     Rx / DC Orders   ED Discharge Orders     None        Note:  This document was prepared using Dragon voice recognition software and may include unintentional dictation errors.   Nicholaus Rolland BRAVO, MD 04/16/24 [phone]  "

## 2024-04-15 NOTE — ED Notes (Signed)
 Fall risk bundle is currently in place.

## 2024-04-15 NOTE — Progress Notes (Signed)
 ANTICOAGULATION CONSULT NOTE  Pharmacy Consult for heparin  infusion Indication: atrial fibrillation  Allergies[1]  Patient Measurements: Height: 5' 2 (157.5 cm) Weight: 82.6 kg (182 lb) IBW/kg (Calculated) : 50.1 HEPARIN  DW (KG): 68.6  Vital Signs: Temp: 97.4 F (36.3 C) (01/10 2214) Temp Source: Oral (01/10 2214) BP: 118/60 (01/10 2251) Pulse Rate: 141 (01/10 2251)  Labs: Recent Labs    04/15/24 2220  HGB 11.1*  HCT 35.3*  PLT 350    CrCl cannot be calculated (Patient's most recent lab result is older than the maximum 21 days allowed.).   Medical History: Past Medical History:  Diagnosis Date   Arthritis    Depression    Diabetes mellitus without complication (HCC)    Fibromyalgia    Hypertension    Thyroid  disease     Assessment: Pt is a 69 yo female presenting to ED c/o SOB, found to be in afib RVR (rate 140-160s).  Goal of Therapy:  Heparin  level 0.3-0.7 units/ml Monitor platelets by anticoagulation protocol: Yes   Plan:  Bolus 4000 units x 1 Start heparin  infusion at 900 units/hr Will check HL in 8 hr after start of infusion CBC daily while on heparin   Rankin CANDIE Dills, PharmD, [facility] 04/15/2024 11:39 PM       [1]  Allergies Allergen Reactions   Augmentin [Amoxicillin-Pot Clavulanate] Diarrhea    Has patient had a PCN reaction causing immediate rash, facial/tongue/throat swelling, SOB or lightheadedness with hypotension: No Has patient had a PCN reaction causing severe rash involving mucus membranes or skin necrosis: No Has patient had a PCN reaction that required hospitalization: No Has patient had a PCN reaction occurring within the last 10 years: No If all of the above answers are NO, then may proceed with Cephalosporin use.   Gabapentin Other (See Comments)    High doses led to falls   Lisinopril Other (See Comments)    Reaction: Blood pressure

## 2024-04-15 NOTE — ED Triage Notes (Signed)
 Pt called EMS for shortness of breath. Used home inhaler 3 times prior without relief. States she has been being treated for URI with steroids and antibiotics. Has been taking as prescribed. On EMS arrival, found to be in afib RVR (rate 140-160s). Denies chest pain.

## 2024-04-16 ENCOUNTER — Inpatient Hospital Stay: Admit: 2024-04-16 | Discharge: 2024-04-16 | Disposition: A | Attending: Family Medicine

## 2024-04-16 DIAGNOSIS — I130 Hypertensive heart and chronic kidney disease with heart failure and stage 1 through stage 4 chronic kidney disease, or unspecified chronic kidney disease: Secondary | ICD-10-CM | POA: Diagnosis present

## 2024-04-16 DIAGNOSIS — J9801 Acute bronchospasm: Secondary | ICD-10-CM | POA: Diagnosis not present

## 2024-04-16 DIAGNOSIS — F319 Bipolar disorder, unspecified: Secondary | ICD-10-CM | POA: Diagnosis present

## 2024-04-16 DIAGNOSIS — I5031 Acute diastolic (congestive) heart failure: Secondary | ICD-10-CM

## 2024-04-16 DIAGNOSIS — I2489 Other forms of acute ischemic heart disease: Secondary | ICD-10-CM | POA: Diagnosis present

## 2024-04-16 DIAGNOSIS — I4891 Unspecified atrial fibrillation: Secondary | ICD-10-CM | POA: Diagnosis present

## 2024-04-16 DIAGNOSIS — N2581 Secondary hyperparathyroidism of renal origin: Secondary | ICD-10-CM | POA: Diagnosis present

## 2024-04-16 DIAGNOSIS — I509 Heart failure, unspecified: Secondary | ICD-10-CM | POA: Diagnosis not present

## 2024-04-16 DIAGNOSIS — R0602 Shortness of breath: Secondary | ICD-10-CM | POA: Diagnosis present

## 2024-04-16 DIAGNOSIS — J189 Pneumonia, unspecified organism: Secondary | ICD-10-CM

## 2024-04-16 DIAGNOSIS — E785 Hyperlipidemia, unspecified: Secondary | ICD-10-CM | POA: Diagnosis present

## 2024-04-16 DIAGNOSIS — J440 Chronic obstructive pulmonary disease with acute lower respiratory infection: Secondary | ICD-10-CM | POA: Diagnosis present

## 2024-04-16 DIAGNOSIS — N179 Acute kidney failure, unspecified: Secondary | ICD-10-CM

## 2024-04-16 DIAGNOSIS — E872 Acidosis, unspecified: Secondary | ICD-10-CM | POA: Diagnosis present

## 2024-04-16 DIAGNOSIS — I429 Cardiomyopathy, unspecified: Secondary | ICD-10-CM | POA: Diagnosis present

## 2024-04-16 DIAGNOSIS — E1165 Type 2 diabetes mellitus with hyperglycemia: Secondary | ICD-10-CM | POA: Diagnosis present

## 2024-04-16 DIAGNOSIS — E039 Hypothyroidism, unspecified: Secondary | ICD-10-CM | POA: Diagnosis present

## 2024-04-16 DIAGNOSIS — E1122 Type 2 diabetes mellitus with diabetic chronic kidney disease: Secondary | ICD-10-CM | POA: Diagnosis present

## 2024-04-16 DIAGNOSIS — F419 Anxiety disorder, unspecified: Secondary | ICD-10-CM | POA: Diagnosis present

## 2024-04-16 DIAGNOSIS — Z7901 Long term (current) use of anticoagulants: Secondary | ICD-10-CM | POA: Diagnosis not present

## 2024-04-16 DIAGNOSIS — K219 Gastro-esophageal reflux disease without esophagitis: Secondary | ICD-10-CM | POA: Insufficient documentation

## 2024-04-16 DIAGNOSIS — D631 Anemia in chronic kidney disease: Secondary | ICD-10-CM | POA: Diagnosis present

## 2024-04-16 DIAGNOSIS — J441 Chronic obstructive pulmonary disease with (acute) exacerbation: Secondary | ICD-10-CM

## 2024-04-16 DIAGNOSIS — E66811 Obesity, class 1: Secondary | ICD-10-CM | POA: Diagnosis present

## 2024-04-16 DIAGNOSIS — N1831 Chronic kidney disease, stage 3a: Secondary | ICD-10-CM | POA: Diagnosis present

## 2024-04-16 DIAGNOSIS — I447 Left bundle-branch block, unspecified: Secondary | ICD-10-CM | POA: Diagnosis not present

## 2024-04-16 DIAGNOSIS — Z1152 Encounter for screening for COVID-19: Secondary | ICD-10-CM | POA: Diagnosis not present

## 2024-04-16 DIAGNOSIS — I5023 Acute on chronic systolic (congestive) heart failure: Secondary | ICD-10-CM | POA: Diagnosis present

## 2024-04-16 DIAGNOSIS — J9601 Acute respiratory failure with hypoxia: Secondary | ICD-10-CM | POA: Diagnosis present

## 2024-04-16 DIAGNOSIS — E875 Hyperkalemia: Secondary | ICD-10-CM | POA: Diagnosis present

## 2024-04-16 LAB — LACTIC ACID, PLASMA
Lactic Acid, Venous: 2.3 mmol/L (ref 0.5–1.9)
Lactic Acid, Venous: 3.1 mmol/L (ref 0.5–1.9)
Lactic Acid, Venous: 1.9 mmol/L (ref 0.5–1.9)

## 2024-04-16 LAB — CBG MONITORING, ED
Glucose-Capillary: 388 mg/dL — ABNORMAL HIGH (ref 70–99)
Glucose-Capillary: 216 mg/dL — ABNORMAL HIGH (ref 70–99)
Glucose-Capillary: 87 mg/dL (ref 70–99)

## 2024-04-16 LAB — ECHOCARDIOGRAM COMPLETE
AR max vel: 1.53 cm2
AV Peak grad: 13.5 mmHg
Ao pk vel: 1.84 m/s
Calc EF: 33.4 %
Height: 62 in
MV M vel: 4.76 m/s
MV Peak grad: 90.6 mmHg
P 1/2 time: 237 ms
Radius: 0.9 cm
S' Lateral: 3.3 cm
Single Plane A2C EF: 35 %
Single Plane A4C EF: 36.1 %
Weight: 2912 [oz_av]

## 2024-04-16 LAB — PROTIME-INR
INR: 1.2 (ref 0.8–1.2)
INR: 1.2 (ref 0.8–1.2)
INR: 1.2 (ref 0.8–1.2)
Prothrombin Time: 15.5 s — ABNORMAL HIGH (ref 11.4–15.2)
Prothrombin Time: 15.6 s — ABNORMAL HIGH (ref 11.4–15.2)
Prothrombin Time: 15.9 s — ABNORMAL HIGH (ref 11.4–15.2)

## 2024-04-16 LAB — POTASSIUM
Potassium: 5.6 mmol/L — ABNORMAL HIGH (ref 3.5–5.1)
Potassium: 5.6 mmol/L — ABNORMAL HIGH (ref 3.5–5.1)

## 2024-04-16 LAB — COMPREHENSIVE METABOLIC PANEL WITH GFR
ALT: 23 U/L (ref 0–44)
AST: 20 U/L (ref 15–41)
Albumin: 4.4 g/dL (ref 3.5–5.0)
Alkaline Phosphatase: 72 U/L (ref 38–126)
Anion gap: 17 — ABNORMAL HIGH (ref 5–15)
BUN: 47 mg/dL — ABNORMAL HIGH (ref 8–23)
CO2: 19 mmol/L — ABNORMAL LOW (ref 22–32)
Calcium: 9.4 mg/dL (ref 8.9–10.3)
Chloride: 97 mmol/L — ABNORMAL LOW (ref 98–111)
Creatinine, Ser: 1.3 mg/dL — ABNORMAL HIGH (ref 0.44–1.00)
GFR, Estimated: 44 mL/min — ABNORMAL LOW
Glucose, Bld: 305 mg/dL — ABNORMAL HIGH (ref 70–99)
Potassium: 5.3 mmol/L — ABNORMAL HIGH (ref 3.5–5.1)
Sodium: 132 mmol/L — ABNORMAL LOW (ref 135–145)
Total Bilirubin: 0.4 mg/dL (ref 0.0–1.2)
Total Protein: 8 g/dL (ref 6.5–8.1)

## 2024-04-16 LAB — TROPONIN T, HIGH SENSITIVITY
Troponin T High Sensitivity: 22 ng/L — ABNORMAL HIGH (ref 0–19)
Troponin T High Sensitivity: 22 ng/L — ABNORMAL HIGH (ref 0–19)

## 2024-04-16 LAB — BASIC METABOLIC PANEL WITH GFR
Anion gap: 13 (ref 5–15)
BUN: 48 mg/dL — ABNORMAL HIGH (ref 8–23)
CO2: 20 mmol/L — ABNORMAL LOW (ref 22–32)
Calcium: 8.8 mg/dL — ABNORMAL LOW (ref 8.9–10.3)
Chloride: 98 mmol/L (ref 98–111)
Creatinine, Ser: 1.21 mg/dL — ABNORMAL HIGH (ref 0.44–1.00)
GFR, Estimated: 48 mL/min — ABNORMAL LOW
Glucose, Bld: 364 mg/dL — ABNORMAL HIGH (ref 70–99)
Potassium: 5.7 mmol/L — ABNORMAL HIGH (ref 3.5–5.1)
Sodium: 131 mmol/L — ABNORMAL LOW (ref 135–145)

## 2024-04-16 LAB — CBC
HCT: 32.2 % — ABNORMAL LOW (ref 36.0–46.0)
Hemoglobin: 10.1 g/dL — ABNORMAL LOW (ref 12.0–15.0)
MCH: 28.9 pg (ref 26.0–34.0)
MCHC: 31.4 g/dL (ref 30.0–36.0)
MCV: 92 fL (ref 80.0–100.0)
Platelets: 321 K/uL (ref 150–400)
RBC: 3.5 MIL/uL — ABNORMAL LOW (ref 3.87–5.11)
RDW: 15.4 % (ref 11.5–15.5)
WBC: 14 K/uL — ABNORMAL HIGH (ref 4.0–10.5)
nRBC: 0 % (ref 0.0–0.2)

## 2024-04-16 LAB — HEMOGLOBIN A1C
Hgb A1c MFr Bld: 8 % — ABNORMAL HIGH (ref 4.8–5.6)
Mean Plasma Glucose: 182.9 mg/dL

## 2024-04-16 LAB — HEPARIN LEVEL (UNFRACTIONATED)
Heparin Unfractionated: 0.41 [IU]/mL (ref 0.30–0.70)
Heparin Unfractionated: 0.38 [IU]/mL (ref 0.30–0.70)

## 2024-04-16 LAB — PRO BRAIN NATRIURETIC PEPTIDE: Pro Brain Natriuretic Peptide: 6481 pg/mL — ABNORMAL HIGH

## 2024-04-16 LAB — T4, FREE: Free T4: 0.93 ng/dL (ref 0.80–2.00)

## 2024-04-16 LAB — TSH: TSH: 5.15 u[IU]/mL — ABNORMAL HIGH (ref 0.350–4.500)

## 2024-04-16 LAB — GLUCOSE, CAPILLARY
Glucose-Capillary: 223 mg/dL — ABNORMAL HIGH (ref 70–99)
Glucose-Capillary: 145 mg/dL — ABNORMAL HIGH (ref 70–99)

## 2024-04-16 LAB — APTT
aPTT: 136 s — ABNORMAL HIGH (ref 24–36)
aPTT: 27 s (ref 24–36)

## 2024-04-16 LAB — HIV ANTIBODY (ROUTINE TESTING W REFLEX): HIV Screen 4th Generation wRfx: NONREACTIVE

## 2024-04-16 LAB — MAGNESIUM: Magnesium: 2.4 mg/dL (ref 1.7–2.4)

## 2024-04-16 LAB — CORTISOL-AM, BLOOD: Cortisol - AM: 39.6 ug/dL — ABNORMAL HIGH (ref 6.7–22.6)

## 2024-04-16 MED ORDER — HYDROCODONE-ACETAMINOPHEN 5-325 MG PO TABS
1.0000 | ORAL_TABLET | Freq: Three times a day (TID) | ORAL | Status: DC | PRN
  Administered 2024-04-16: 1 via ORAL
  Filled 2024-04-16: qty 1

## 2024-04-16 MED ORDER — EMPAGLIFLOZIN 25 MG PO TABS
25.0000 mg | ORAL_TABLET | Freq: Every day | ORAL | Status: DC
  Administered 2024-04-16 – 2024-04-21 (×6): 25 mg via ORAL
  Filled 2024-04-16 (×6): qty 1

## 2024-04-16 MED ORDER — LEVALBUTEROL HCL 1.25 MG/0.5ML IN NEBU
1.2500 mg | INHALATION_SOLUTION | Freq: Four times a day (QID) | RESPIRATORY_TRACT | Status: DC
  Administered 2024-04-16 – 2024-04-17 (×4): 1.25 mg via RESPIRATORY_TRACT
  Filled 2024-04-16 (×5): qty 0.5

## 2024-04-16 MED ORDER — DIGOXIN 0.25 MG/ML IJ SOLN
0.2500 mg | Freq: Once | INTRAMUSCULAR | Status: DC
  Filled 2024-04-16: qty 2

## 2024-04-16 MED ORDER — PANTOPRAZOLE SODIUM 40 MG PO TBEC
40.0000 mg | DELAYED_RELEASE_TABLET | Freq: Every day | ORAL | Status: DC
  Administered 2024-04-16 – 2024-04-21 (×6): 40 mg via ORAL
  Filled 2024-04-16 (×6): qty 1

## 2024-04-16 MED ORDER — INSULIN ASPART 100 UNIT/ML IJ SOLN
0.0000 [IU] | Freq: Three times a day (TID) | INTRAMUSCULAR | Status: DC
  Administered 2024-04-16: 15 [IU] via SUBCUTANEOUS
  Administered 2024-04-16 (×2): 5 [IU] via SUBCUTANEOUS
  Administered 2024-04-17: 2 [IU] via SUBCUTANEOUS
  Administered 2024-04-17: 8 [IU] via SUBCUTANEOUS
  Administered 2024-04-17 – 2024-04-18 (×2): 5 [IU] via SUBCUTANEOUS
  Administered 2024-04-18: 3 [IU] via SUBCUTANEOUS
  Administered 2024-04-19 (×2): 5 [IU] via SUBCUTANEOUS
  Administered 2024-04-19 – 2024-04-20 (×2): 8 [IU] via SUBCUTANEOUS
  Administered 2024-04-20: 3 [IU] via SUBCUTANEOUS
  Administered 2024-04-20: 8 [IU] via SUBCUTANEOUS
  Administered 2024-04-21: 5 [IU] via SUBCUTANEOUS
  Filled 2024-04-16: qty 8
  Filled 2024-04-16: qty 5
  Filled 2024-04-16 (×2): qty 8
  Filled 2024-04-16: qty 5
  Filled 2024-04-16: qty 15
  Filled 2024-04-16 (×3): qty 5
  Filled 2024-04-16 (×2): qty 3
  Filled 2024-04-16 (×2): qty 8
  Filled 2024-04-16: qty 3
  Filled 2024-04-16: qty 5

## 2024-04-16 MED ORDER — SODIUM ZIRCONIUM CYCLOSILICATE 10 G PO PACK
10.0000 g | PACK | Freq: Once | ORAL | Status: AC
  Administered 2024-04-16: 10 g via ORAL
  Filled 2024-04-16: qty 1

## 2024-04-16 MED ORDER — AMIODARONE LOAD VIA INFUSION
150.0000 mg | Freq: Once | INTRAVENOUS | Status: AC
  Administered 2024-04-16: 150 mg via INTRAVENOUS
  Filled 2024-04-16: qty 83.34

## 2024-04-16 MED ORDER — DOXYCYCLINE HYCLATE 100 MG PO TABS
100.0000 mg | ORAL_TABLET | Freq: Two times a day (BID) | ORAL | Status: AC
  Administered 2024-04-16 – 2024-04-20 (×10): 100 mg via ORAL
  Filled 2024-04-16 (×10): qty 1

## 2024-04-16 MED ORDER — LOSARTAN POTASSIUM 50 MG PO TABS: 50.0000 mg | ORAL_TABLET | Freq: Every day | ORAL | Status: DC

## 2024-04-16 MED ORDER — DIGOXIN 0.25 MG/ML IJ SOLN
0.5000 mg | Freq: Once | INTRAMUSCULAR | Status: AC
  Administered 2024-04-16: 0.5 mg via INTRAVENOUS
  Filled 2024-04-16: qty 2

## 2024-04-16 MED ORDER — ONDANSETRON HCL 4 MG/2ML IJ SOLN: 4.0000 mg | Freq: Four times a day (QID) | INTRAMUSCULAR | Status: DC | PRN

## 2024-04-16 MED ORDER — FUROSEMIDE 10 MG/ML IJ SOLN
60.0000 mg | Freq: Once | INTRAMUSCULAR | Status: AC
  Administered 2024-04-16: 60 mg via INTRAVENOUS
  Filled 2024-04-16: qty 6

## 2024-04-16 MED ORDER — TRAZODONE HCL 50 MG PO TABS
25.0000 mg | ORAL_TABLET | Freq: Every evening | ORAL | Status: DC | PRN
  Administered 2024-04-16 – 2024-04-17 (×3): 25 mg via ORAL
  Filled 2024-04-16 (×3): qty 1

## 2024-04-16 MED ORDER — IPRATROPIUM BROMIDE 0.02 % IN SOLN
0.5000 mg | Freq: Four times a day (QID) | RESPIRATORY_TRACT | Status: DC
  Administered 2024-04-16 – 2024-04-17 (×4): 0.5 mg via RESPIRATORY_TRACT
  Filled 2024-04-16 (×5): qty 2.5

## 2024-04-16 MED ORDER — ACETAMINOPHEN 650 MG RE SUPP: 650.0000 mg | Freq: Four times a day (QID) | RECTAL | Status: DC | PRN

## 2024-04-16 MED ORDER — LEVOTHYROXINE SODIUM 112 MCG PO TABS
112.0000 ug | ORAL_TABLET | Freq: Every day | ORAL | Status: DC
  Administered 2024-04-16 – 2024-04-21 (×6): 112 ug via ORAL
  Filled 2024-04-16 (×6): qty 1

## 2024-04-16 MED ORDER — DULOXETINE HCL 30 MG PO CPEP
60.0000 mg | ORAL_CAPSULE | Freq: Every day | ORAL | Status: DC
  Administered 2024-04-16 – 2024-04-21 (×6): 60 mg via ORAL
  Filled 2024-04-16 (×4): qty 2
  Filled 2024-04-16: qty 1
  Filled 2024-04-16: qty 2

## 2024-04-16 MED ORDER — VITAMIN D3 25 MCG (1000 UNIT) PO TABS
2000.0000 [IU] | ORAL_TABLET | Freq: Every day | ORAL | Status: DC
  Administered 2024-04-16 – 2024-04-21 (×6): 2000 [IU] via ORAL
  Filled 2024-04-16 (×12): qty 2

## 2024-04-16 MED ORDER — DIAZEPAM 5 MG PO TABS
2.5000 mg | ORAL_TABLET | Freq: Two times a day (BID) | ORAL | Status: DC | PRN
  Administered 2024-04-16 – 2024-04-19 (×4): 2.5 mg via ORAL
  Filled 2024-04-16 (×4): qty 1

## 2024-04-16 MED ORDER — IPRATROPIUM BROMIDE 0.02 % IN SOLN
0.5000 mg | Freq: Four times a day (QID) | RESPIRATORY_TRACT | Status: DC | PRN
  Administered 2024-04-16 – 2024-04-17 (×2): 0.5 mg via RESPIRATORY_TRACT
  Filled 2024-04-16 (×2): qty 2.5

## 2024-04-16 MED ORDER — DIGOXIN 0.25 MG/ML IJ SOLN
0.1250 mg | Freq: Every day | INTRAMUSCULAR | Status: DC
  Administered 2024-04-17: 0.125 mg via INTRAVENOUS
  Filled 2024-04-16: qty 2

## 2024-04-16 MED ORDER — DIGOXIN 0.25 MG/ML IJ SOLN
0.2500 mg | Freq: Once | INTRAMUSCULAR | Status: AC
  Administered 2024-04-17: 0.25 mg via INTRAVENOUS
  Filled 2024-04-16: qty 2

## 2024-04-16 MED ORDER — SODIUM CHLORIDE 0.9 % IV SOLN: 2.0000 g | INTRAVENOUS | Status: DC

## 2024-04-16 MED ORDER — METOPROLOL SUCCINATE ER 25 MG PO TB24
25.0000 mg | ORAL_TABLET | Freq: Three times a day (TID) | ORAL | Status: DC
  Administered 2024-04-16 – 2024-04-17 (×4): 25 mg via ORAL
  Filled 2024-04-16 (×4): qty 1

## 2024-04-16 MED ORDER — GUAIFENESIN ER 600 MG PO TB12
600.0000 mg | ORAL_TABLET | Freq: Two times a day (BID) | ORAL | Status: DC
  Administered 2024-04-16 – 2024-04-21 (×11): 600 mg via ORAL
  Filled 2024-04-16 (×11): qty 1

## 2024-04-16 MED ORDER — BUDESONIDE 0.25 MG/2ML IN SUSP
0.2500 mg | Freq: Two times a day (BID) | RESPIRATORY_TRACT | Status: DC
  Administered 2024-04-16 – 2024-04-21 (×8): 0.25 mg via RESPIRATORY_TRACT
  Filled 2024-04-16 (×11): qty 2

## 2024-04-16 MED ORDER — IPRATROPIUM-ALBUTEROL 0.5-2.5 (3) MG/3ML IN SOLN
3.0000 mL | RESPIRATORY_TRACT | Status: DC | PRN
  Administered 2024-04-16: 3 mL via RESPIRATORY_TRACT
  Filled 2024-04-16: qty 3

## 2024-04-16 MED ORDER — GLIPIZIDE 10 MG PO TABS
10.0000 mg | ORAL_TABLET | Freq: Every day | ORAL | Status: DC
  Filled 2024-04-16: qty 1

## 2024-04-16 MED ORDER — HYDROCOD POLI-CHLORPHE POLI ER 10-8 MG/5ML PO SUER: 5.0000 mL | Freq: Two times a day (BID) | ORAL | Status: DC | PRN

## 2024-04-16 MED ORDER — METHOCARBAMOL 500 MG PO TABS: 500.0000 mg | ORAL_TABLET | Freq: Four times a day (QID) | ORAL | Status: DC | PRN

## 2024-04-16 MED ORDER — INSULIN ASPART 100 UNIT/ML IV SOLN
10.0000 [IU] | Freq: Once | INTRAVENOUS | Status: AC
  Administered 2024-04-16: 10 [IU] via INTRAVENOUS
  Filled 2024-04-16: qty 10

## 2024-04-16 MED ORDER — IPRATROPIUM-ALBUTEROL 0.5-2.5 (3) MG/3ML IN SOLN: 3.0000 mL | Freq: Four times a day (QID) | RESPIRATORY_TRACT | Status: DC

## 2024-04-16 MED ORDER — METHYLPREDNISOLONE SODIUM SUCC 40 MG IJ SOLR
40.0000 mg | Freq: Two times a day (BID) | INTRAMUSCULAR | Status: DC
  Administered 2024-04-16 – 2024-04-17 (×4): 40 mg via INTRAVENOUS
  Filled 2024-04-16 (×4): qty 1

## 2024-04-16 MED ORDER — DIAZEPAM 5 MG/ML IJ SOLN
2.0000 mg | Freq: Once | INTRAMUSCULAR | Status: AC
  Administered 2024-04-16: 2 mg via INTRAVENOUS
  Filled 2024-04-16: qty 2

## 2024-04-16 MED ORDER — ONDANSETRON HCL 4 MG PO TABS: 4.0000 mg | ORAL_TABLET | Freq: Four times a day (QID) | ORAL | Status: DC | PRN

## 2024-04-16 MED ORDER — CARBAMAZEPINE ER 200 MG PO TB12
400.0000 mg | ORAL_TABLET | Freq: Every day | ORAL | Status: DC
  Administered 2024-04-16: 400 mg via ORAL
  Filled 2024-04-16 (×2): qty 2

## 2024-04-16 MED ORDER — ASPIRIN 81 MG PO TBEC
81.0000 mg | DELAYED_RELEASE_TABLET | Freq: Every day | ORAL | Status: DC
  Administered 2024-04-16: 81 mg via ORAL
  Filled 2024-04-16: qty 1

## 2024-04-16 MED ORDER — FUROSEMIDE 10 MG/ML IJ SOLN
40.0000 mg | Freq: Two times a day (BID) | INTRAMUSCULAR | Status: DC
  Administered 2024-04-16 – 2024-04-17 (×2): 40 mg via INTRAVENOUS
  Filled 2024-04-16 (×3): qty 4

## 2024-04-16 MED ORDER — LEVALBUTEROL HCL 1.25 MG/0.5ML IN NEBU
1.2500 mg | INHALATION_SOLUTION | Freq: Four times a day (QID) | RESPIRATORY_TRACT | Status: DC | PRN
  Administered 2024-04-17: 1.25 mg via RESPIRATORY_TRACT
  Filled 2024-04-16 (×2): qty 0.5

## 2024-04-16 MED ORDER — LEVALBUTEROL HCL 1.25 MG/0.5ML IN NEBU: 1.2500 mg | INHALATION_SOLUTION | Freq: Four times a day (QID) | RESPIRATORY_TRACT | Status: DC | PRN

## 2024-04-16 MED ORDER — DEXTROSE 50 % IV SOLN
1.0000 | Freq: Once | INTRAVENOUS | Status: AC
  Administered 2024-04-16: 50 mL via INTRAVENOUS
  Filled 2024-04-16: qty 50

## 2024-04-16 MED ORDER — LACTATED RINGERS IV SOLN
150.0000 mL/h | INTRAVENOUS | Status: DC
  Administered 2024-04-16: 150 mL/h via INTRAVENOUS

## 2024-04-16 MED ORDER — AMLODIPINE BESYLATE 5 MG PO TABS
10.0000 mg | ORAL_TABLET | Freq: Every day | ORAL | Status: DC
  Administered 2024-04-16: 10 mg via ORAL
  Filled 2024-04-16: qty 2

## 2024-04-16 MED ORDER — SACCHAROMYCES BOULARDII 250 MG PO CAPS
250.0000 mg | ORAL_CAPSULE | Freq: Two times a day (BID) | ORAL | Status: DC
  Administered 2024-04-16 – 2024-04-21 (×11): 250 mg via ORAL
  Filled 2024-04-16 (×11): qty 1

## 2024-04-16 MED ORDER — INSULIN ASPART 100 UNIT/ML IJ SOLN
0.0000 [IU] | Freq: Every day | INTRAMUSCULAR | Status: DC
  Administered 2024-04-18 – 2024-04-20 (×2): 2 [IU] via SUBCUTANEOUS
  Filled 2024-04-16 (×2): qty 2

## 2024-04-16 MED ORDER — AMIODARONE HCL IN DEXTROSE 360-4.14 MG/200ML-% IV SOLN
30.0000 mg/h | INTRAVENOUS | Status: AC
  Administered 2024-04-17 – 2024-04-20 (×6): 30 mg/h via INTRAVENOUS
  Filled 2024-04-16 (×6): qty 200

## 2024-04-16 MED ORDER — AMIODARONE HCL IN DEXTROSE 360-4.14 MG/200ML-% IV SOLN
60.0000 mg/h | INTRAVENOUS | Status: AC
  Administered 2024-04-16: 60 mg/h via INTRAVENOUS
  Filled 2024-04-16 (×2): qty 200

## 2024-04-16 MED ORDER — MAGNESIUM HYDROXIDE 400 MG/5ML PO SUSP: 30.0000 mL | Freq: Every day | ORAL | Status: DC | PRN

## 2024-04-16 MED ORDER — ROSUVASTATIN CALCIUM 10 MG PO TABS
40.0000 mg | ORAL_TABLET | Freq: Every day | ORAL | Status: DC
  Administered 2024-04-16 – 2024-04-21 (×6): 40 mg via ORAL
  Filled 2024-04-16 (×3): qty 4
  Filled 2024-04-16: qty 2
  Filled 2024-04-16 (×2): qty 4

## 2024-04-16 MED ORDER — FUROSEMIDE 10 MG/ML IJ SOLN
20.0000 mg | Freq: Once | INTRAMUSCULAR | Status: AC | PRN
  Administered 2024-04-16: 20 mg via INTRAVENOUS
  Filled 2024-04-16: qty 2

## 2024-04-16 MED ORDER — ACETAMINOPHEN 325 MG PO TABS: 650.0000 mg | ORAL_TABLET | Freq: Four times a day (QID) | ORAL | Status: DC | PRN

## 2024-04-16 MED ORDER — PERFLUTREN LIPID MICROSPHERE
1.0000 mL | INTRAVENOUS | Status: AC | PRN
  Administered 2024-04-16: 1.5 mL via INTRAVENOUS

## 2024-04-16 MED ORDER — SODIUM CHLORIDE 0.9 % IV SOLN
2.0000 g | INTRAVENOUS | Status: AC
  Administered 2024-04-16 – 2024-04-20 (×5): 2 g via INTRAVENOUS
  Filled 2024-04-16 (×5): qty 20

## 2024-04-16 NOTE — Assessment & Plan Note (Signed)
-   The patient will be admitted to a progressive unit bed. - She will be continued on IV Cardizem  drip and IV heparin  drip. - 2D echo and cardiology consult will be obtained. - Dr. Florencio was notified and is aware about the patient.

## 2024-04-16 NOTE — ED Notes (Signed)
 Assisted with changing the pt into a gown as using the bedpan was not successful and she urinated on the chuck pad. Changed the pad, pt has not further needs at this time.

## 2024-04-16 NOTE — Assessment & Plan Note (Signed)
-   She will be placed on scheduled and as needed DuoNebs as well as mucolytic therapy. - She will be placed on antibiotic therapy as above. - Will add IV steroid therapy with Solu-Medrol .

## 2024-04-16 NOTE — Consult Note (Signed)
 " CARDIOLOGY CONSULT NOTE               Patient ID: Amber Stevenson MRN: [id number] DOB/AGE: [birth year] 69 y.o.  Admit date: 04/15/2024 Referring Physician Dr. Madison Hotter hospitalist Primary Physician Darice Cooper primary Primary Cardiologist  Reason for Consultation wide-complex tachycardia congestive heart failure atrial fibrillation shortness of breath  HPI: 69 year old female long-term smoker presented with worsening dyspnea shortness of breath found to have COPD hypertension diabetes as well as some chest pain heart rate was significantly tachycardic pulse ox was in the 80s she called rescue because of inability to lie flat and severe dyspnea.  Patient had some vague chest discomfort no recent cardiac evaluation had been seen by urgent care for upper respiratory tract infection treated with prednisone  and antibiotics symptoms got progressively worse he came to the emergency room and then was admitted for further evaluation.  Elevated BNP flat troponins.  Patient was placed on Cardizem  for rate control  Review of systems complete and found to be negative unless listed above     Past Medical History:  Diagnosis Date   Arthritis    Depression    Diabetes mellitus without complication (HCC)    Fibromyalgia    Hypertension    Thyroid  disease     Past Surgical History:  Procedure Laterality Date   ABDOMINAL HYSTERECTOMY     APPENDECTOMY     HAND SURGERY Right    REPLACEMENT TOTAL KNEE BILATERAL     THYROID  SURGERY      (Not in a hospital admission)  Social History   Socioeconomic History   Marital status: Single    Spouse name: Not on file   Number of children: Not on file   Years of education: Not on file   Highest education level: Not on file  Occupational History   Not on file  Tobacco Use   Smoking status: Former   Smokeless tobacco: Not on file  Substance and Sexual Activity   Alcohol use: No   Drug use: No   Sexual activity: Not on file  Other Topics  Concern   Not on file  Social History Narrative   Not on file   Social Drivers of Health   Tobacco Use: Medium Risk (04/15/2024)   Patient History    Smoking Tobacco Use: Former    Smokeless Tobacco Use: Unknown    Passive Exposure: Not on Actuary Strain: Low Risk  (12/01/2023)   [facility]   Overall Financial Resource Strain (CARDIA)    Difficulty of Paying Living Expenses: Not hard at all  Food Insecurity: No Food Insecurity (12/01/2023)   Received from [facility]   Epic    Within the past 12 months, you worried that your food would run out before you got the money to buy more.: Never true    Within the past 12 months, the food you bought just didn't last and you didn't have money to get more.: Never true  Transportation Needs: No Transportation Needs (12/01/2023)   Received from [facility] - Transportation    In the past 12 months, has lack of transportation kept you from medical appointments or from getting medications?: No    Lack of Transportation (Non-Medical): No  Physical Activity: Not on file  Stress: Not on file  Social Connections: Not on file  Intimate Partner Violence: Not on file  Depression (EYV7-0): Not on file  Alcohol Screen:  Not on file  Housing: Low Risk  (04/12/2024)   Received from [facility]   Epic    In the last 12 months, was there a time when you were not able to pay the mortgage or rent on time?: No    In the past 12 months, how many times have you moved where you were living?: 0    At any time in the past 12 months, were you homeless or living in a shelter (including now)?: No  Utilities: Not At Risk (12/01/2023)   Received from [facility]   Epic    In the past 12 months has the electric, gas, oil, or water company threatened to shut off services in your home?: No  Health Literacy: Not on file    Family History   Problem Relation Age of Onset   Breast cancer Mother 47      Review of systems complete and found to be negative unless listed above      PHYSICAL EXAM  General: Well developed, well nourished, in no acute distress HEENT:  Normocephalic and atramatic Neck:  No JVD.  Lungs: Clear bilaterally to auscultation and percussion. Heart:  irregular irregular tachycardic. Normal S1 and S2 without gallops or murmurs.  Abdomen: Bowel sounds are positive, abdomen soft and non-tender  Msk:  Back normal, normal gait. Normal strength and tone for age. Extremities: No clubbing, cyanosis or edema.   Neuro: Alert and oriented X 3. Psych:  Good affect, responds appropriately  Labs:   Lab Results  Component Value Date   WBC 14.0 (H) 04/16/2024   HGB 10.1 (L) 04/16/2024   HCT 32.2 (L) 04/16/2024   MCV 92.0 04/16/2024   PLT 321 04/16/2024    Recent Labs  Lab 04/15/24 2220 04/16/24 0450 04/16/24 1013  NA 132* 131*  --   K 5.3* 5.7* 5.6*  CL 97* 98  --   CO2 19* 20*  --   BUN 47* 48*  --   CREATININE 1.30* 1.21*  --   CALCIUM  9.4 8.8*  --   PROT 8.0  --   --   BILITOT 0.4  --   --   ALKPHOS 72  --   --   ALT 23  --   --   AST 20  --   --   GLUCOSE 305* 364*  --    Lab Results  Component Value Date   CKTOTAL 23 (L) 04/17/2014   CKMB < 0.5 (L) 04/17/2014   TROPONINI <0.03 11/26/2017   No results found for: CHOL No results found for: HDL No results found for: LDLCALC No results found for: TRIG No results found for: CHOLHDL No results found for: LDLDIRECT    Radiology: DG Chest Portable 1 View Result Date: 04/15/2024 CLINICAL DATA:  Short of breath EXAM: PORTABLE CHEST 1 VIEW COMPARISON:  02/11/2021 FINDINGS: Single frontal view of the chest demonstrates an enlarged cardiac silhouette. Bibasilar veiling opacities, right greater than left, consistent with consolidation and/or effusions. No pneumothorax. No acute bony abnormalities. IMPRESSION: 1. Constellation of  findings suggesting congestive heart failure, with bibasilar edema and effusions. Electronically Signed   By: Ozell Daring M.D.   On: 04/15/2024 23:06    EKG: Wide-complex tachycardia atrial fibrillation rate of 150  ASSESSMENT AND PLAN:  Atrial fibrillation Acute congestive heart failure Community-acquired pneumonia COPD Uncontrolled diabetes Acute renal insufficiency GERD Dyslipidemia Acute HFrEF .  Plan. Agree with admit to telemetry follow-up heart rate control Anticoagulation  with heparin  hopefully can transition to Eliquis  Shortness of breath probably related to poor left ventricular function Chronic renal insufficiency CVA continue current therapy maintain diuresis Wide-complex tachycardia A-fib with aberrancy agree with amiodarone  rate control Cardiomyopathy ejection fraction around 30 to 35% Recommend heart failure therapy and management ACE ARB Arni beta-blocker MRA SGLT2 Continue diabetes management and control Modest weight loss exercise portion control COPD with exacerbation continue inhalers supplemental oxygen as necessary consider follow-up with pulmonary Shortness of breath dyspnea related to heart failure wide-complex tachycardia COPD Discontinue diltiazem  transition to beta-blocker or digoxin  for rate control Consider ischemic workup possibly cardiac cath   Signed: Cara JONETTA Lovelace MD, 04/16/2024, 12:25 PM      "

## 2024-04-16 NOTE — Assessment & Plan Note (Addendum)
-   I suspect this is acute diastolic CHF. - The patient will be diuresed with IV Lasix . - 2D echo and cardiology consult will be obtained as mentioned above. - Will continue Jardiance .

## 2024-04-16 NOTE — Assessment & Plan Note (Signed)
-   This may be associated with mild sepsis given her tachycardia and tachypnea as well as leukocytosis. - Will continue IV Rocephin  and p.o. doxycycline . - Mucolytic therapy and bronchodilator therapy will be provided. - Will follow blood cultures.

## 2024-04-16 NOTE — Assessment & Plan Note (Signed)
-   This is likely prerenal due to acute CHF and renal hypoperfusion. - Will follow BMP with diuresis. - Will hold off nephrotoxins.

## 2024-04-16 NOTE — Progress Notes (Signed)
 Heart Failure Navigator Progress Note  Assessed for Heart & Vascular TOC clinic readiness.  Patient is currently under the care of [facility].  Navigator will sign off at this time.  Charmaine Pines, RN, BSN [facility] Heart Failure Navigator Secure Chat Only

## 2024-04-16 NOTE — Progress Notes (Signed)
 Echocardiogram 2D Echocardiogram has been performed.  Oz Gammel N Treva Huyett,RDCS 04/16/2024, 9:41 AM

## 2024-04-16 NOTE — Assessment & Plan Note (Signed)
--  The patient will be placed on supplement coverage with NovoLog . - Will hold off metformin. - Will continue Glucotrol .

## 2024-04-16 NOTE — ED Notes (Signed)
 Pt ambulatory to bedside toilet at this time with cherene RN

## 2024-04-16 NOTE — Assessment & Plan Note (Signed)
-   The patient will be placed on supplement coverage with NovoLog . - Will hold off metformin.

## 2024-04-16 NOTE — ED Notes (Signed)
 MD made aware of pt's HR in 120s AFIB.

## 2024-04-16 NOTE — Progress Notes (Signed)
 ANTICOAGULATION CONSULT NOTE  Pharmacy Consult for heparin  infusion Indication: atrial fibrillation  Allergies[1]  Patient Measurements: Height: 5' 2 (157.5 cm) Weight: 82.6 kg (182 lb) IBW/kg (Calculated) : 50.1 HEPARIN  DW (KG): 68.6  Vital Signs: Temp: 97.6 F (36.4 C) (01/11 1011) Temp Source: Oral (01/11 1011) BP: 107/61 (01/11 1025) Pulse Rate: 136 (01/11 0930)  Labs: Recent Labs    04/15/24 2220 04/16/24 0023 04/16/24 0450 04/16/24 1013  HGB 11.1*  --  10.1*  --   HCT 35.3*  --  32.2*  --   PLT 350  --  321  --   APTT  --  27 136*  --   LABPROT  --  15.6* 15.9* 15.5*  INR  --  1.2 1.2 1.2  HEPARINUNFRC  --   --   --  0.41  CREATININE 1.30*  --  1.21*  --     Estimated Creatinine Clearance: 43.1 mL/min (A) (by C-G formula based on SCr of 1.21 mg/dL (H)).   Medical History: Past Medical History:  Diagnosis Date   Arthritis    Depression    Diabetes mellitus without complication (HCC)    Fibromyalgia    Hypertension    Thyroid  disease     Assessment: Pt is a 69 yo female presenting to ED c/o SOB, found to be in afib RVR (rate 140-160s).  Goal of Therapy:  Heparin  level 0.3-0.7 units/ml Monitor platelets by anticoagulation protocol: Yes  1/11 1013 HL 0.41  therapeutic x 1   Plan:  1/11 1013 HL 0.41  therapeutic x 1 continue heparin  infusion at 900 units/hr Will check confirmatory HL in 8 hr  CBC daily while on heparin   Treyvonne Tata PharmD Clinical Pharmacist 04/16/2024         [1]  Allergies Allergen Reactions   Augmentin [Amoxicillin-Pot Clavulanate] Diarrhea    Has patient had a PCN reaction causing immediate rash, facial/tongue/throat swelling, SOB or lightheadedness with hypotension: No Has patient had a PCN reaction causing severe rash involving mucus membranes or skin necrosis: No Has patient had a PCN reaction that required hospitalization: No Has patient had a PCN reaction occurring within the last 10 years: No If all  of the above answers are NO, then may proceed with Cephalosporin use.   Gabapentin Other (See Comments)    High doses led to falls   Lisinopril Other (See Comments)    Reaction: Blood pressure

## 2024-04-16 NOTE — Assessment & Plan Note (Signed)
-   Will continue PPI therapy.

## 2024-04-16 NOTE — H&P (Addendum)
 "     [place]   PATIENT NAME: Amber Stevenson    MR#:  [id number]  DATE OF BIRTH:  05/20/1955  DATE OF ADMISSION:  04/15/2024  PRIMARY CARE PHYSICIAN: Delfina Pao, MD   Patient is coming from: Home  REQUESTING/REFERRING PHYSICIAN: Nicholaus Maize, MD  CHIEF COMPLAINT:   Chief Complaint  Patient presents with   Shortness of Breath    HISTORY OF PRESENT ILLNESS:  Amber Stevenson is a 69 y.o. female with medical history significant for depression, type II diabetes mellitus, hypertension, COPD, fibromyalgia, aortic insufficiency and hypothyroidism, who presented to the emergency room with acute onset of worsening dyspnea over the last couple of days that significantly deteriorated today with diminished pulse oximetry to 86% on room air.  She admitted demonstrated congested cough and wheezing.  She saw her PCP on Wednesday and she was prescribed prednisone  and an antibiotic and felt better for couple of days before her symptoms worsened since last night.    No current fever or chills.  No nausea or vomiting or abdominal pain.  No dysuria, oliguria or hematuria or flank pain.  No bleeding diathesis.  No chest pain or palpitations.  No bleeding diathesis.  ED Course: When the patient came to the ER, heart rate was 148 and atrial fibrillation with RVR and BP 151/56 and temperature 97.4 with otherwise normal vital signs.  Labs revealed mild hyponatremia 132 and hypochloremia 97, hyperkalemia 5.3 and CO2 19 with anion gap of 17, BUN of 47 and creatinine 1.3 above previous levels with blood glucose of 305.  High sensitive troponin T was 22 and later the same.  proBNP was 6481 and procalcitonin was 0.1 with lactic acid 4.  CBC showed leukocytosis 14.6 with neutrophilia and hemoglobin was 11.1 hematocrit 35.3.  PT was 15.6 with INR 1.2 and PTT 27.  TSH was 5.1 and free T4 was 0.93.  Respiratory panel came back negative.  Blood cultures were drawn.  EKG as reviewed by me : EKG showed left bundle  blanch block and sinus tachycardia with sinus arrhythmia with a rate of 150. Imaging:  Portable chest x-ray showed suspected CHF.  The patient was given 250 mL/h IV normal saline and was started on IV heparin  drip, was given IV doxycycline  100 mg and 1 g of IV Rocephin , 4 baby aspirin  and was started on IV Cardizem  drip with improvement of heart rate to 124.  Dr. Florencio was notified and is aware about patient.  He recommends IV heparin  with bolus and drip.  The patient will be admitted to the progressive unit bed for further evaluation and management. PAST MEDICAL HISTORY:   Past Medical History:  Diagnosis Date   Arthritis    Depression    Diabetes mellitus without complication (HCC)    Fibromyalgia    Hypertension    Thyroid  disease     PAST SURGICAL HISTORY:   Past Surgical History:  Procedure Laterality Date   ABDOMINAL HYSTERECTOMY     APPENDECTOMY     HAND SURGERY Right    REPLACEMENT TOTAL KNEE BILATERAL     THYROID  SURGERY      SOCIAL HISTORY:   Social History   Tobacco Use   Smoking status: Former   Smokeless tobacco: Not on file  Substance Use Topics   Alcohol use: No    FAMILY HISTORY:   Family History  Problem Relation Age of Onset   Breast cancer Mother 70    DRUG ALLERGIES:  Allergies[1]  REVIEW OF  SYSTEMS:   ROS As per history of present illness. All pertinent systems were reviewed above. Constitutional, HEENT, cardiovascular, respiratory, GI, GU, musculoskeletal, neuro, psychiatric, endocrine, integumentary and hematologic systems were reviewed and are otherwise negative/unremarkable except for positive findings mentioned above in the HPI.   MEDICATIONS AT HOME:   Prior to Admission medications  Medication Sig Start Date End Date Taking? Authorizing Provider  albuterol  (PROVENTIL  HFA;VENTOLIN  HFA) 108 (90 BASE) MCG/ACT inhaler Inhale 1 puff into the lungs every 4 (four) hours as needed for wheezing or shortness of breath.   Yes [provider]  albuterol  (PROVENTIL ) (2.5 MG/3ML) 0.083% nebulizer solution Take 2.5 mg by nebulization every 4 (four) hours as needed for wheezing or shortness of breath.   Yes [provider]  amLODipine  (NORVASC ) 10 MG tablet Take 10 mg by mouth daily.   Yes [provider]  aspirin  EC 81 MG tablet Take 81 mg by mouth daily.   Yes [provider]  budesonide -formoterol (SYMBICORT) 160-4.5 MCG/ACT inhaler Inhale 2 puffs into the lungs 2 (two) times daily.   Yes [provider]  carbamazepine  (TEGRETOL  XR) 400 MG 12 hr tablet Take 400 mg by mouth daily.    Yes [provider]  Cholecalciferol  (VITAMIN D) 2000 units tablet Take 2,000 Units by mouth daily.   Yes [provider]  diazepam  (VALIUM ) 5 MG tablet Take 2.5 mg by mouth every 12 (twelve) hours as needed for anxiety.    Yes [provider]  doxycycline  (VIBRAMYCIN ) 100 MG capsule Take 100 mg by mouth 2 (two) times daily. 04/12/24 04/22/24 Yes [provider]  DULoxetine  (CYMBALTA ) 60 MG capsule Take 60 mg by mouth daily.   Yes [provider]  glipiZIDE  (GLUCOTROL ) 5 MG tablet Take 10 mg by mouth daily before breakfast.   Yes [provider]  HYDROcodone -acetaminophen  (NORCO/VICODIN) 5-325 MG per tablet Take 1 tablet by mouth every 8 (eight) hours as needed for moderate pain.   Yes [provider]  JARDIANCE  25 MG TABS tablet Take 25 mg by mouth daily. 11/16/23  Yes [provider]  levothyroxine  (SYNTHROID , LEVOTHROID) 112 MCG tablet Take 112 mcg by mouth daily.   Yes [provider]  losartan  (COZAAR ) 50 MG tablet Take 50 mg by mouth daily.   Yes [provider]  metFORMIN (GLUCOPHAGE) 1000 MG tablet Take 1,000 mg by mouth 2 (two) times daily with a meal.   Yes [provider]  methocarbamol  (ROBAXIN ) 500 MG tablet Take 500 mg by mouth 4 (four) times daily as needed for muscle spasms.    Yes [provider]  omeprazole (PRILOSEC) 20 MG capsule Take 20 mg by mouth daily.   Yes [provider]  pravastatin (PRAVACHOL) 80 MG tablet Take 80 mg by mouth at bedtime.   Yes [provider]  predniSONE  (DELTASONE ) 10 MG tablet Take 10 mg by mouth See admin instructions. 3 tabs 2x daily days 1, 2, 3; 2 tabs 2x daily days 4, 5, 6; 1 tab 2x daily days 7, 8, 9; half tab 2x daily days 10, 11 and 12 04/12/24  Yes [provider]  rosuvastatin  (CRESTOR ) 40 MG tablet Take 40 mg by mouth daily. 01/21/23  Yes [provider]  saccharomyces boulardii (FLORASTOR) 250 MG capsule Take 250 mg by mouth 2 (two) times daily.   Yes [provider]      VITAL SIGNS:  Blood pressure (!) 156/70, pulse (!) 114, temperature 98.1 F (36.7 C),  temperature source Oral, resp. rate 20, height 5' 2 (1.575 m), weight 82.6 kg, SpO2 98%.  PHYSICAL EXAMINATION:  Physical Exam  GENERAL:  69 y.o.-year-old female patient lying in the bed with no acute distress.  EYES: Pupils equal, round, reactive to light and accommodation. No scleral icterus. Extraocular muscles intact.  HEENT: Head atraumatic, normocephalic. Oropharynx and nasopharynx clear.  NECK:  Supple, no jugular venous distention. No thyroid  enlargement, no tenderness.  LUNGS: Bibasilar breath sounds with bibasilar rales and diffuse expiratory wheezes with tight expiratory airflow and harsh vesicular breathing. No use of accessory muscles of respiration.  CARDIOVASCULAR: Regular rate and rhythm, S1, S2 normal. No murmurs, rubs, or gallops.  ABDOMEN: Soft, nondistended, nontender. Bowel sounds present. No organomegaly or mass.  EXTREMITIES: No pedal edema, cyanosis, or clubbing.  NEUROLOGIC: Cranial nerves II through XII are intact. Muscle strength 5/5 in all extremities. Sensation intact. Gait not checked.  PSYCHIATRIC: The patient is alert and oriented x 3.  Normal affect and good eye contact. SKIN: No obvious rash,  lesion, or ulcer.   LABORATORY PANEL:   CBC Recent Labs  Lab 04/16/24 0450  WBC 14.0*  HGB 10.1*  HCT 32.2*  PLT 321   ------------------------------------------------------------------------------------------------------------------  Chemistries  Recent Labs  Lab 04/15/24 2220 04/16/24 0450  NA 132* 131*  K 5.3* 5.7*  CL 97* 98  CO2 19* 20*  GLUCOSE 305* 364*  BUN 47* 48*  CREATININE 1.30* 1.21*  CALCIUM  9.4 8.8*  MG 2.4  --   AST 20  --   ALT 23  --   ALKPHOS 72  --   BILITOT 0.4  --    ------------------------------------------------------------------------------------------------------------------  Cardiac Enzymes No results for input(s): TROPONINI in the last 168 hours. ------------------------------------------------------------------------------------------------------------------  RADIOLOGY:  DG Chest Portable 1 View Result Date: 04/15/2024 CLINICAL DATA:  Short of breath EXAM: PORTABLE CHEST 1 VIEW COMPARISON:  02/11/2021 FINDINGS: Single frontal view of the chest demonstrates an enlarged cardiac silhouette. Bibasilar veiling opacities, right greater than left, consistent with consolidation and/or effusions. No pneumothorax. No acute bony abnormalities. IMPRESSION: 1. Constellation of findings suggesting congestive heart failure, with bibasilar edema and effusions. Electronically Signed   By: Ozell Daring M.D.   On: 04/15/2024 23:06   IMPRESSION AND PLAN:  Assessment and Plan: * Atrial fibrillation with RVR (HCC) - The patient will be admitted to a progressive unit bed. - She will be continued on IV Cardizem  drip and IV heparin  drip. - 2D echo and cardiology consult will be obtained. - Dr. Florencio was notified and is aware about the patient.  Acute CHF (congestive heart failure) (HCC) - I suspect this is acute diastolic CHF. - The patient will be diuresed with IV Lasix . - 2D echo and cardiology consult will be obtained as mentioned above. - Will  continue Jardiance .   CAP (community acquired pneumonia) - This may be associated with mild sepsis given her tachycardia and tachypnea as well as leukocytosis. - Will continue IV Rocephin  and p.o. doxycycline . - Mucolytic therapy and bronchodilator therapy will be provided. - Will follow blood cultures.  COPD with acute exacerbation (HCC) - She will be placed on scheduled and as needed DuoNebs as well as mucolytic therapy. - She will be placed on antibiotic therapy as above. - Will add IV steroid therapy with Solu-Medrol .  Uncontrolled type 2 diabetes mellitus with hyperglycemia, without long-term current use of insulin  (HCC) --The patient will be placed on supplement coverage with NovoLog . - Will hold off metformin. - Will continue  Glucotrol .  AKI (acute kidney injury) - This is likely prerenal due to acute CHF and renal hypoperfusion. - Will follow BMP with diuresis. - Will hold off nephrotoxins.  GERD without esophagitis - Will continue PPI therapy.  Dyslipidemia - The patient will be placed on supplement coverage with NovoLog . - Will hold off metformin.   DVT prophylaxis: IV heparin  Advanced Care Planning:  Code Status: full code,  Family Communication:  The plan of care was discussed in details with the patient (and family). I answered all questions. The patient agreed to proceed with the above mentioned plan. Further management will depend upon hospital course. Disposition Plan: Back to previous home environment Consults called: Cardiology All the records are reviewed and case discussed with ED provider.  Status is: Inpatient  At the time of the admission, it appears that the appropriate admission status for this patient is inpatient.  This is judged to be reasonable and necessary in order to provide the required intensity of service to ensure the patient's safety given the presenting symptoms, physical exam findings and initial radiographic and laboratory data in the  context of comorbid conditions.  The patient requires inpatient status due to high intensity of service, high risk of further deterioration and high frequency of surveillance required.  I certify that at the time of admission, it is my clinical judgment that the patient will require inpatient hospital care extending more than 2 midnights.                            Dispo: The patient is from: Home              Anticipated d/c is to: Home              Patient currently is not medically stable to d/c.              Difficult to place patient: No  [place]ison DELENA Peaches M.D on 04/16/2024 at 6:25 AM  [facility]   From 7 PM-7 AM, contact night-coverage [email or web address]  CC: Primary care physician; Delfina Pao, MD     [1]  Allergies Allergen Reactions   Augmentin [Amoxicillin-Pot Clavulanate] Diarrhea    Has patient had a PCN reaction causing immediate rash, facial/tongue/throat swelling, SOB or lightheadedness with hypotension: No Has patient had a PCN reaction causing severe rash involving mucus membranes or skin necrosis: No Has patient had a PCN reaction that required hospitalization: No Has patient had a PCN reaction occurring within the last 10 years: No If all of the above answers are NO, then may proceed with Cephalosporin use.   Gabapentin Other (See Comments)    High doses led to falls   Lisinopril Other (See Comments)    Reaction: Blood pressure    "

## 2024-04-16 NOTE — ED Notes (Signed)
 Answered call light, pt advised needs to use the bathroom, placed on a bedpan

## 2024-04-16 NOTE — Progress Notes (Addendum)
 ANTICOAGULATION CONSULT NOTE  Pharmacy Consult for heparin  infusion Indication: atrial fibrillation  Allergies[1]  Patient Measurements: Height: 5' 2 (157.5 cm) Weight: 82.6 kg (182 lb) IBW/kg (Calculated) : 50.1 HEPARIN  DW (KG): 68.6  Vital Signs: Temp: 97.2 F (36.2 C) (01/11 1700) Temp Source: Oral (01/11 1011) BP: 155/71 (01/11 1700) Pulse Rate: 114 (01/11 1700)  Labs: Recent Labs    04/15/24 2220 04/16/24 0023 04/16/24 0450 04/16/24 1013 04/16/24 1842  HGB 11.1*  --  10.1*  --   --   HCT 35.3*  --  32.2*  --   --   PLT 350  --  321  --   --   APTT  --  27 136*  --   --   LABPROT  --  15.6* 15.9* 15.5*  --   INR  --  1.2 1.2 1.2  --   HEPARINUNFRC  --   --   --  0.41 0.38  CREATININE 1.30*  --  1.21*  --   --     Estimated Creatinine Clearance: 43.1 mL/min (A) (by C-G formula based on SCr of 1.21 mg/dL (H)).   Medical History: Past Medical History:  Diagnosis Date   Arthritis    Depression    Diabetes mellitus without complication (HCC)    Fibromyalgia    Hypertension    Thyroid  disease     Assessment: Pt is a 69 yo female presenting to ED c/o SOB, found to be in afib RVR (rate 140-160s).  Goal of Therapy:  Heparin  level 0.3-0.7 units/ml Monitor platelets by anticoagulation protocol: Yes  1/11 1013 HL 0.41  therapeutic x 1 1/11 1955 HL 0.38  therapeutic x2   Plan:  HL  therapeutic x 2 Continue heparin  infusion at 900 units/hr Will check HL daily with AM labs given x2 consecutive therapeutic levels CBC daily while on heparin   Annabella LOISE Banks, PharmD Clinical Pharmacist 04/16/2024 8:00 PM      [1]  Allergies Allergen Reactions   Augmentin [Amoxicillin-Pot Clavulanate] Diarrhea    Has patient had a PCN reaction causing immediate rash, facial/tongue/throat swelling, SOB or lightheadedness with hypotension: No Has patient had a PCN reaction causing severe rash involving mucus membranes or skin necrosis: No Has patient had a PCN  reaction that required hospitalization: No Has patient had a PCN reaction occurring within the last 10 years: No If all of the above answers are NO, then may proceed with Cephalosporin use.   Gabapentin Other (See Comments)    High doses led to falls   Lisinopril Other (See Comments)    Reaction: Blood pressure

## 2024-04-16 NOTE — Progress Notes (Addendum)
 " PROGRESS NOTE    Amber Stevenson  FMW:[id number] DOB: Aug 08, 1955 DOA: 04/15/2024 PCP: Delfina Pao, MD  Chief Complaint  Patient presents with   Shortness of Breath    Hospital Course:  Amber Stevenson is a 69 y.o. female with medical history significant for depression, type II diabetes mellitus, hypertension, COPD, fibromyalgia, aortic insufficiency and hypothyroidism presented with DOE, hypoxia.  Was seen outpatient and was prescribed antibiotics and prednisone , felt better for couple days and symptoms continue to worsen.  Patient admitted for A-fib with RVR, acute heart failure, elevated troponin with new LBBB on EKG, pneumonia, COPD exacerbation.  Hospital course as below  Subjective: Patient was examined at bedside, new to me today. Reports DOE significantly better today, continues to be tachycardic in A-fib with RVR Cardiology consulted for further recs  Addendum: Patient reports feeling anxious and is tachypneic. Fine crackles bilateral bases, no wheezing Will give Valium  IV 2 mg 1 dose   Objective: Vitals:   04/16/24 1050 04/16/24 1145 04/16/24 1212 04/16/24 1441  BP:   (!) 112/56 98/79  Pulse: (!) 118 (!) 140 (!) 122 (!) 126  Resp: 16 (!) 25 (!) 25 (!) 28  Temp:      TempSrc:      SpO2: 99% 96% 95% 100%  Weight:      Height:        Intake/Output Summary (Last 24 hours) at 04/16/2024 1504 Last data filed at 04/16/2024 0305 Gross per 24 hour  Intake 600 ml  Output --  Net 600 ml   Filed Weights   04/15/24 2214  Weight: 82.6 kg    Examination: GENERAL:  69 y.o.-year-old female patient lying in the bed with no acute distress.  NECK:  Supple, no jugular venous distention. No thyroid  enlargement, no tenderness.  LUNGS: Bibasilar breath sounds with bibasilar rales and diffuse expiratory wheezes with tight expiratory airflow and harsh vesicular breathing. No use of accessory muscles of respiration.  CARDIOVASCULAR: Tachycardic, irregular, S1, S2 normal. No  murmurs, rubs, or gallops.  ABDOMEN: Soft, nondistended, nontender. Bowel sounds present. No organomegaly or mass.  EXTREMITIES: No pedal edema, cyanosis, or clubbing.  NEUROLOGIC: AO x 3, no gross focal deficits SKIN: No obvious rash, lesion, or ulcer  Assessment & Plan:  Atrial fibrillation with RVR continued IV Cardizem  drip and IV heparin  drip. Cardiology consult pending further recs   Acute CHF (congestive heart failure) (HCC) BNP elevated IV Lasix  40 mg bid, continue Jardiance  Echo pending Cardiology consulted  Elevated troponin EKG with new LBBB Denies chest pain. EKG with new LBBB On IV heparin , cardiology for further recs   CAP (community acquired pneumonia) Lactic acidosis Lactic acidosis, leukocytosis Flu/COVID/RSV negative Continue IV ceftriaxone , doxycycline  Follow-up blood cultures, check RVP  COPD with acute exacerbation IV Solu-Medrol , Xopenex , ipratropium nebs due to tachycardia  AKI vs CKD stage 3a/b ?? cardiorenal syndrome Cr 1.3 -> 1.21, baseline Cr ~ 1.2 Continue diuresis as above Monitor creatinine  Hyperkalemia Potassium 5.7 Insulin  plus D50, on IV Lasix , Lokelma  1 dose Repeat potassium 4pm  K 5.6 -> Repeat Insulin  plus D50, on IV Lasix , Lokelma  1 dose Repeat potassium at mignight   Uncontrolled type 2 diabetes mellitus with hyperglycemia, without long-term current use of insulin  Steroid Induced hyperglycemia HbA1c 8.0 Hold metformin, glipizide  SSI  Pseudohyponatremia due to hyperglycemia Corrected sodium 135  Normocytic anemia Monitor Hb  HTN Hold amlodipine , resume as BP tolerates losartan  on hold due to AKI  Hypothyroidism TSH 5.15, FT4 0.93 Continue home levothyroxine   GERD  without esophagitis PPI   Dyslipidemia Continue Crestor   Anxiety On Valium  as needed  Obesity Class I Body mass index is 33.29 kg/m. Outpatient follow up for lifestyle modification and risk factor management   DVT prophylaxis: IV Heparin     Code Status: Full Code Disposition:  TBD  Consultants:  Treatment Team:  Consulting Physician: Florencio Cara BIRCH, MD  Procedures:  None  Antimicrobials:  Anti-infectives (From admission, onward)    Start     Dose/Rate Route Frequency Ordered Stop   04/16/24 1200  cefTRIAXone  (ROCEPHIN ) 2 g in sodium chloride  0.9 % 100 mL IVPB        2 g 200 mL/hr over 30 Minutes Intravenous Every 24 hours 04/16/24 0418 04/21/24 1159   04/16/24 1000  doxycycline  (VIBRA -TABS) tablet 100 mg        100 mg Oral 2 times daily 04/16/24 0418     04/16/24 0500  cefTRIAXone  (ROCEPHIN ) 2 g in sodium chloride  0.9 % 100 mL IVPB  Status:  Discontinued        2 g 200 mL/hr over 30 Minutes Intravenous Every 24 hours 04/16/24 0450 04/16/24 0454   04/16/24 0000  cefTRIAXone  (ROCEPHIN ) 1 g in sodium chloride  0.9 % 100 mL IVPB        1 g 200 mL/hr over 30 Minutes Intravenous  Once 04/15/24 2358 04/16/24 0042   04/16/24 0000  doxycycline  (VIBRAMYCIN ) 100 mg in sodium chloride  0.9 % 250 mL IVPB        100 mg 125 mL/hr over 120 Minutes Intravenous  Once 04/15/24 2358 04/16/24 0305       Data Reviewed: I have personally reviewed following labs and imaging studies CBC: Recent Labs  Lab 04/15/24 2220 04/16/24 0450  WBC 14.6* 14.0*  NEUTROABS 9.9*  --   HGB 11.1* 10.1*  HCT 35.3* 32.2*  MCV 92.2 92.0  PLT 350 321   Basic Metabolic Panel: Recent Labs  Lab 04/15/24 2220 04/16/24 0450 04/16/24 1013  NA 132* 131*  --   K 5.3* 5.7* 5.6*  CL 97* 98  --   CO2 19* 20*  --   GLUCOSE 305* 364*  --   BUN 47* 48*  --   CREATININE 1.30* 1.21*  --   CALCIUM  9.4 8.8*  --   MG 2.4  --   --    GFR: Estimated Creatinine Clearance: 43.1 mL/min (A) (by C-G formula based on SCr of 1.21 mg/dL (H)). Liver Function Tests: Recent Labs  Lab 04/15/24 2220  AST 20  ALT 23  ALKPHOS 72  BILITOT 0.4  PROT 8.0  ALBUMIN 4.4   CBG: Recent Labs  Lab 04/16/24 0559 04/16/24 0817 04/16/24 1212  GLUCAP 388* 216* 87     Recent Results (from the past 240 hours)  Resp panel by RT-PCR (RSV, Flu A&B, Covid) Anterior Nasal Swab     Status: None   Collection Time: 04/15/24 10:20 PM   Specimen: Anterior Nasal Swab  Result Value Ref Range Status   SARS Coronavirus 2 by RT PCR NEGATIVE NEGATIVE Final    Comment: (NOTE) SARS-CoV-2 target nucleic acids are NOT DETECTED.  The SARS-CoV-2 RNA is generally detectable in upper respiratory specimens during the acute phase of infection. The lowest concentration of SARS-CoV-2 viral copies this assay can detect is 138 copies/mL. A negative result does not preclude SARS-Cov-2 infection and should not be used as the sole basis for treatment or other patient management decisions. A negative result may occur with  improper specimen  collection/handling, submission of specimen other than nasopharyngeal swab, presence of viral mutation(s) within the areas targeted by this assay, and inadequate number of viral copies(<138 copies/mL). A negative result must be combined with clinical observations, patient history, and epidemiological information. The expected result is Negative.  Fact Sheet for Patients:  bloggercourse.com  Fact Sheet for Healthcare Providers:  seriousbroker.it  This test is no t yet approved or cleared by the [place]  FDA and  has been authorized for detection and/or diagnosis of SARS-CoV-2 by FDA under an Emergency Use Authorization (EUA). This EUA will remain  in effect (meaning this test can be used) for the duration of the COVID-19 declaration under Section 564(b)(1) of the Act, 21 U.S.C.section 360bbb-3(b)(1), unless the authorization is terminated  or revoked sooner.       Influenza A by PCR NEGATIVE NEGATIVE Final   Influenza B by PCR NEGATIVE NEGATIVE Final    Comment: (NOTE) The Xpert Xpress SARS-CoV-2/FLU/RSV plus assay is intended as an aid in the diagnosis of influenza from  Nasopharyngeal swab specimens and should not be used as a sole basis for treatment. Nasal washings and aspirates are unacceptable for Xpert Xpress SARS-CoV-2/FLU/RSV testing.  Fact Sheet for Patients: bloggercourse.com  Fact Sheet for Healthcare Providers: seriousbroker.it  This test is not yet approved or cleared by the [place]  FDA and has been authorized for detection and/or diagnosis of SARS-CoV-2 by FDA under an Emergency Use Authorization (EUA). This EUA will remain in effect (meaning this test can be used) for the duration of the COVID-19 declaration under Section 564(b)(1) of the Act, 21 U.S.C. section 360bbb-3(b)(1), unless the authorization is terminated or revoked.     Resp Syncytial Virus by PCR NEGATIVE NEGATIVE Final    Comment: (NOTE) Fact Sheet for Patients: bloggercourse.com  Fact Sheet for Healthcare Providers: seriousbroker.it  This test is not yet approved or cleared by the [place]  FDA and has been authorized for detection and/or diagnosis of SARS-CoV-2 by FDA under an Emergency Use Authorization (EUA). This EUA will remain in effect (meaning this test can be used) for the duration of the COVID-19 declaration under Section 564(b)(1) of the Act, 21 U.S.C. section 360bbb-3(b)(1), unless the authorization is terminated or revoked.  Performed at [facility], [address] [place], [place] [address]   Blood culture (routine x 2)     Status: None (Preliminary result)   Collection Time: 04/15/24 10:20 PM   Specimen: BLOOD  Result Value Ref Range Status   Specimen Description BLOOD BLOOD LEFT ARM  Final   Special Requests   Final    BOTTLES DRAWN AEROBIC AND ANAEROBIC Blood Culture adequate volume   Culture   Final    NO GROWTH < 12 HOURS Performed at [facility], [address] [place], [place] [address]    Report Status  PENDING  Incomplete  Blood culture (routine x 2)     Status: None (Preliminary result)   Collection Time: 04/15/24 10:20 PM   Specimen: BLOOD  Result Value Ref Range Status   Specimen Description BLOOD BLOOD RIGHT FOREARM  Final   Special Requests   Final    BOTTLES DRAWN AEROBIC AND ANAEROBIC Blood Culture adequate volume   Culture   Final    NO GROWTH < 12 HOURS Performed at [facility], [address], [place], [place] [address]    Report Status PENDING  Incomplete     Radiology Studies: ECHOCARDIOGRAM COMPLETE Result Date: 04/16/2024    ECHOCARDIOGRAM REPORT  Patient Name:   PRIYA Pandey Date of Exam: 04/16/2024 Medical Rec #:  [id number]       Height:       62.0 in Accession #:    [id number]      Weight:       182.0 lb Date of Birth:  12-Jul-1955       BSA:          1.837 m Patient Age:    68 years        BP:           156/70 mmHg Patient Gender: F               HR:           116 bpm. Exam Location:  Inpatient Procedure: 2D Echo, Color Doppler, Cardiac Doppler and Intracardiac            Opacification Agent (Both Spectral and Color Flow Doppler were            utilized during procedure). Indications:     CHF-Acute Diastolic  History:         Patient has no prior history of Echocardiogram examinations.                  Risk Factors:Hypertension, Diabetes, Former Smoker and Thyroid                   Disease.  Sonographer:     Logan Shove RDCS Referring Phys:  [id number] JAN A MANSY Diagnosing Phys: Annabella Scarce MD  Sonographer Comments: Technically difficult study due to poor echo windows. IMPRESSIONS  1. Left ventricular ejection fraction, by estimation, is 30 to 35%. The left ventricle has moderately decreased function. The left ventricle demonstrates global hypokinesis. Left ventricular diastolic parameters are indeterminate.  2. Right ventricular systolic function is mildly reduced. The right ventricular size is normal. There is moderately elevated pulmonary artery systolic  pressure.  3. Left atrial size was mildly dilated.  4. There is a round echodensity on the mitral valve chord measuring 0.8 x 0.5 cm. This likely represents calcification. Cannot rule out endocarditis. Consider TEE or cardiac MRI to better evaluate. The mitral valve is degenerative. Moderate mitral valve  regurgitation. No evidence of mitral stenosis. The mean mitral valve gradient is 3.3 mmHg with average heart rate of 119 bpm.  5. Tricuspid valve regurgitation is mild to moderate.  6. The aortic valve is normal in structure. Aortic valve regurgitation is moderate to severe. No aortic stenosis is present. Aortic regurgitation PHT measures 237 msec.  7. The inferior vena cava is dilated in size with <50% respiratory variability, suggesting right atrial pressure of 15 mmHg. FINDINGS  Left Ventricle: Left ventricular ejection fraction, by estimation, is 30 to 35%. The left ventricle has moderately decreased function. The left ventricle demonstrates global hypokinesis. Definity  contrast agent was given IV to delineate the left ventricular endocardial borders. The left ventricular internal cavity size was normal in size. There is no left ventricular hypertrophy. Left ventricular diastolic parameters are indeterminate. Right Ventricle: The right ventricular size is normal. No increase in right ventricular wall thickness. Right ventricular systolic function is mildly reduced. There is moderately elevated pulmonary artery systolic pressure. The tricuspid regurgitant velocity is 3.34 m/s, and with an assumed right atrial pressure of 15 mmHg, the estimated right ventricular systolic pressure is 59.6 mmHg. Left Atrium: Left atrial size was mildly dilated. Right Atrium: Right atrial size was normal in size. Pericardium: There is no evidence  of pericardial effusion. Mitral Valve: There is a round echodensity on the mitral valve chord measuring 0.8 x 0.5 cm. This likely represents calcification. Cannot rule out endocarditis.  Consider TEE or cardiac MRI to better evaluate. The mitral valve is degenerative in appearance. There is moderate thickening of the mitral valve leaflet(s). There is moderate calcification of the mitral valve leaflet(s). Moderate mitral valve regurgitation. No evidence of mitral valve stenosis. The mean mitral valve gradient is 3.3 mmHg  with average heart rate of 119 bpm. Tricuspid Valve: The tricuspid valve is normal in structure. Tricuspid valve regurgitation is mild to moderate. No evidence of tricuspid stenosis. Aortic Valve: The aortic valve is normal in structure. Aortic valve regurgitation is moderate to severe. Aortic regurgitation PHT measures 237 msec. No aortic stenosis is present. Aortic valve peak gradient measures 13.5 mmHg. Pulmonic Valve: The pulmonic valve was normal in structure. Pulmonic valve regurgitation is not visualized. No evidence of pulmonic stenosis. Aorta: The aortic root is normal in size and structure. Venous: The inferior vena cava is dilated in size with less than 50% respiratory variability, suggesting right atrial pressure of 15 mmHg. IAS/Shunts: No atrial level shunt detected by color flow Doppler.  LEFT VENTRICLE PLAX 2D LVIDd:         4.10 cm LVIDs:         3.30 cm LV PW:         0.90 cm LV IVS:        0.80 cm LVOT diam:     1.90 cm LVOT Area:     2.84 cm  LV Volumes (MOD) LV vol d, MOD A2C: 143.0 ml LV vol d, MOD A4C: 106.0 ml LV vol s, MOD A2C: 92.9 ml LV vol s, MOD A4C: 67.7 ml LV SV MOD A2C:     50.1 ml LV SV MOD A4C:     106.0 ml LV SV MOD BP:      42.2 ml RIGHT VENTRICLE            IVC RV Basal diam:  3.50 cm    IVC diam: 2.00 cm RV S prime:     7.34 cm/s TAPSE (M-mode): 1.8 cm LEFT ATRIUM             Index        RIGHT ATRIUM           Index LA diam:        3.80 cm 2.07 cm/m   RA Area:     17.40 cm LA Vol (A2C):   88.7 ml 48.30 ml/m  RA Volume:   45.00 ml  24.50 ml/m LA Vol (A4C):   31.7 ml 17.26 ml/m LA Biplane Vol: 54.4 ml 29.62 ml/m  AORTIC VALVE AV Area  (Vmax): 1.53 cm AV Vmax:        183.60 cm/s AV Peak Grad:   13.5 mmHg LVOT Vmax:      98.98 cm/s AI PHT:         237 msec  AORTA Ao Root diam: 2.30 cm Ao Asc diam:  3.20 cm MITRAL VALVE                  TRICUSPID VALVE MV Mean grad: 3.3 mmHg        TR Peak grad:   44.6 mmHg MR Peak grad:    90.6 mmHg    TR Mean grad:   26.0 mmHg MR Mean grad:    56.0 mmHg    TR Vmax:  334.00 cm/s MR Vmax:         476.00 cm/s  TR Vmean:       232.0 cm/s MR Vmean:        350.0 cm/s MR PISA:         5.09 cm     SHUNTS MR PISA Eff ROA: 41 mm       Systemic Diam: 1.90 cm MR PISA Radius:  0.90 cm Annabella Scarce MD Electronically signed by Annabella Scarce MD Signature Date/Time: 04/16/2024/12:54:51 PM    Final    DG Chest Portable 1 View Result Date: 04/15/2024 CLINICAL DATA:  Short of breath EXAM: PORTABLE CHEST 1 VIEW COMPARISON:  02/11/2021 FINDINGS: Single frontal view of the chest demonstrates an enlarged cardiac silhouette. Bibasilar veiling opacities, right greater than left, consistent with consolidation and/or effusions. No pneumothorax. No acute bony abnormalities. IMPRESSION: 1. Constellation of findings suggesting congestive heart failure, with bibasilar edema and effusions. Electronically Signed   By: Ozell Daring M.D.   On: 04/15/2024 23:06    Scheduled Meds:  aspirin  EC  81 mg Oral Daily   budesonide  (PULMICORT ) nebulizer solution  0.25 mg Nebulization BID   carbamazepine   400 mg Oral Daily   cholecalciferol   2,000 Units Oral Daily   doxycycline   100 mg Oral BID   DULoxetine   60 mg Oral Daily   empagliflozin   25 mg Oral Daily   furosemide   40 mg Intravenous Q12H   guaiFENesin   600 mg Oral BID   insulin  aspart  0-15 Units Subcutaneous TID WC   insulin  aspart  0-5 Units Subcutaneous QHS   ipratropium  0.5 mg Nebulization Q6H   levalbuterol   1.25 mg Nebulization Q6H   levothyroxine   112 mcg Oral Q0600   methylPREDNISolone  (SOLU-MEDROL ) injection  40 mg Intravenous Q12H   pantoprazole   40 mg  Oral Daily   rosuvastatin   40 mg Oral Daily   saccharomyces boulardii  250 mg Oral BID   Continuous Infusions:  cefTRIAXone  (ROCEPHIN )  IV Stopped (04/16/24 1245)   diltiazem  (CARDIZEM ) infusion 5 mg/hr (04/16/24 0749)   heparin  900 Units/hr (04/16/24 0018)   lactated ringers  150 mL/hr (04/16/24 0456)     LOS: 0 days  MDM: Patient is high risk for one or more organ failure.  They necessitate ongoing hospitalization for continued IV therapies and subsequent lab monitoring. Total time spent interpreting labs and vitals, reviewing the medical record, coordinating care amongst consultants and care team members, directly assessing and discussing care with the patient and/or family: 55 min Laree Lock, MD [facility]  To contact the attending physician between 7A-7P please use Epic Chat. To contact the covering physician during after hours 7P-7A, please review Amion.  04/16/2024, 3:04 PM   *This document has been created with the assistance of dictation software. Please excuse typographical errors. *   "

## 2024-04-16 NOTE — ED Notes (Signed)
 Pt becoming increasingly anxious and having SOB. RR 30. MD made aware.

## 2024-04-16 NOTE — ED Notes (Signed)
 MD at bedside to assess pt.

## 2024-04-17 ENCOUNTER — Other Ambulatory Visit (HOSPITAL_COMMUNITY): Payer: Self-pay

## 2024-04-17 ENCOUNTER — Inpatient Hospital Stay

## 2024-04-17 ENCOUNTER — Telehealth (HOSPITAL_COMMUNITY): Payer: Self-pay

## 2024-04-17 DIAGNOSIS — I4891 Unspecified atrial fibrillation: Secondary | ICD-10-CM | POA: Diagnosis not present

## 2024-04-17 LAB — CBC
HCT: 33.2 % — ABNORMAL LOW (ref 36.0–46.0)
Hemoglobin: 10.5 g/dL — ABNORMAL LOW (ref 12.0–15.0)
MCH: 28.5 pg (ref 26.0–34.0)
MCHC: 31.6 g/dL (ref 30.0–36.0)
MCV: 90 fL (ref 80.0–100.0)
Platelets: 321 K/uL (ref 150–400)
RBC: 3.69 MIL/uL — ABNORMAL LOW (ref 3.87–5.11)
RDW: 15.3 % (ref 11.5–15.5)
WBC: 17.8 K/uL — ABNORMAL HIGH (ref 4.0–10.5)
nRBC: 0.1 % (ref 0.0–0.2)

## 2024-04-17 LAB — BASIC METABOLIC PANEL WITH GFR
Anion gap: 17 — ABNORMAL HIGH (ref 5–15)
BUN: 67 mg/dL — ABNORMAL HIGH (ref 8–23)
CO2: 21 mmol/L — ABNORMAL LOW (ref 22–32)
Calcium: 8.9 mg/dL (ref 8.9–10.3)
Chloride: 94 mmol/L — ABNORMAL LOW (ref 98–111)
Creatinine, Ser: 1.94 mg/dL — ABNORMAL HIGH (ref 0.44–1.00)
GFR, Estimated: 27 mL/min — ABNORMAL LOW
Glucose, Bld: 194 mg/dL — ABNORMAL HIGH (ref 70–99)
Potassium: 5.4 mmol/L — ABNORMAL HIGH (ref 3.5–5.1)
Sodium: 132 mmol/L — ABNORMAL LOW (ref 135–145)

## 2024-04-17 LAB — RESPIRATORY PANEL BY PCR

## 2024-04-17 LAB — MAGNESIUM: Magnesium: 2.5 mg/dL — ABNORMAL HIGH (ref 1.7–2.4)

## 2024-04-17 LAB — GLUCOSE, CAPILLARY
Glucose-Capillary: 285 mg/dL — ABNORMAL HIGH (ref 70–99)
Glucose-Capillary: 236 mg/dL — ABNORMAL HIGH (ref 70–99)
Glucose-Capillary: 142 mg/dL — ABNORMAL HIGH (ref 70–99)
Glucose-Capillary: 91 mg/dL (ref 70–99)

## 2024-04-17 LAB — HEPARIN LEVEL (UNFRACTIONATED): Heparin Unfractionated: 0.38 [IU]/mL (ref 0.30–0.70)

## 2024-04-17 LAB — POTASSIUM
Potassium: 4.9 mmol/L (ref 3.5–5.1)
Potassium: 6.4 mmol/L (ref 3.5–5.1)

## 2024-04-17 LAB — DIGOXIN LEVEL: Digoxin Level: 4.9 ng/mL (ref 0.8–2.0)

## 2024-04-17 MED ORDER — SODIUM BICARBONATE 8.4 % IV SOLN
50.0000 meq | Freq: Once | INTRAVENOUS | Status: AC
  Administered 2024-04-17: 50 meq via INTRAVENOUS
  Filled 2024-04-17: qty 50

## 2024-04-17 MED ORDER — INSULIN ASPART 100 UNIT/ML IV SOLN
10.0000 [IU] | Freq: Once | INTRAVENOUS | Status: AC
  Administered 2024-04-17: 10 [IU] via INTRAVENOUS
  Filled 2024-04-17: qty 10

## 2024-04-17 MED ORDER — APIXABAN 5 MG PO TABS
5.0000 mg | ORAL_TABLET | Freq: Two times a day (BID) | ORAL | Status: DC
  Administered 2024-04-17 – 2024-04-21 (×9): 5 mg via ORAL
  Filled 2024-04-17 (×9): qty 1

## 2024-04-17 MED ORDER — DEXTROSE 50 % IV SOLN
1.0000 | Freq: Once | INTRAVENOUS | Status: AC
  Administered 2024-04-17: 50 mL via INTRAVENOUS
  Filled 2024-04-17: qty 50

## 2024-04-17 MED ORDER — SODIUM ZIRCONIUM CYCLOSILICATE 10 G PO PACK
10.0000 g | PACK | Freq: Once | ORAL | Status: AC
  Administered 2024-04-17: 10 g via ORAL
  Filled 2024-04-17: qty 1

## 2024-04-17 MED ORDER — PREDNISONE 20 MG PO TABS
40.0000 mg | ORAL_TABLET | Freq: Every day | ORAL | Status: AC
  Administered 2024-04-18 – 2024-04-21 (×4): 40 mg via ORAL
  Filled 2024-04-17 (×4): qty 2

## 2024-04-17 MED ORDER — ORAL CARE MOUTH RINSE: 15.0000 mL | OROMUCOSAL | Status: DC | PRN

## 2024-04-17 MED ORDER — CALCIUM GLUCONATE-NACL 1-0.675 GM/50ML-% IV SOLN
1.0000 g | Freq: Once | INTRAVENOUS | Status: AC
  Administered 2024-04-17: 1000 mg via INTRAVENOUS
  Filled 2024-04-17: qty 50

## 2024-04-17 MED ORDER — ALBUTEROL SULFATE (2.5 MG/3ML) 0.083% IN NEBU
2.5000 mg | INHALATION_SOLUTION | Freq: Once | RESPIRATORY_TRACT | Status: AC
  Administered 2024-04-17: 2.5 mg via RESPIRATORY_TRACT
  Filled 2024-04-17: qty 3

## 2024-04-17 MED ORDER — DIAZEPAM 2 MG PO TABS: 2.0000 mg | ORAL_TABLET | Freq: Once | ORAL | Status: DC

## 2024-04-17 MED ORDER — CARBAMAZEPINE ER 200 MG PO TB12
200.0000 mg | ORAL_TABLET | Freq: Every day | ORAL | Status: DC
  Administered 2024-04-17 – 2024-04-21 (×5): 200 mg via ORAL
  Filled 2024-04-17 (×5): qty 1

## 2024-04-17 MED ORDER — MENTHOL 3 MG MT LOZG
1.0000 | LOZENGE | OROMUCOSAL | Status: DC | PRN
  Administered 2024-04-17: 3 mg via ORAL
  Filled 2024-04-17: qty 9

## 2024-04-17 NOTE — Plan of Care (Signed)

## 2024-04-17 NOTE — Progress Notes (Signed)
 Heart Failure Stewardship Pharmacy Note  PCP: Delfina Pao, MD PCP-Cardiologist: None  HPI: Amber Stevenson is a 69 y.o. female with depression, type II diabetes mellitus, hypertension, COPD, fibromyalgia, aortic insufficiency and hypothyroidism  who presented with worsening dyspnea. On admission, proBNP was 6481, HS-troponin was 22, TSH was 5.15, T4 was 0.93, A1c was 8, and lactic acid was 4. EKG on admission showed AF with aberrancy and a rate of 150 bpm. Chest x-ray noted findings suggesting congestive heart failure.    Pertinent cardiac history: TTE 10/2019 showed normal LVEF, moderate to severe aortic insufficiency, mild aortic stenosis, mild to moderate tricuspid insufficiency, and mild to moderate LVH. TTE this admission noted LVEF of 30-35% with global hypokinesis, mild RV dysfunction, round echodensity on the mitral valve chord which could be calcification vs endocarditis.  Pertinent Lab Values: Creatinine  Date Value Ref Range Status  04/17/2014 1.67 (H) 0.60 - 1.30 mg/dL Final   Creatinine, Ser  Date Value Ref Range Status  04/17/2024 1.94 (H) 0.44 - 1.00 mg/dL Final   BUN  Date Value Ref Range Status  04/17/2024 67 (H) 8 - 23 mg/dL Final  98/87/7983 35 (H) 7 - 18 mg/dL Final   Potassium  Date Value Ref Range Status  04/17/2024 5.4 (H) 3.5 - 5.1 mmol/L Final  04/17/2014 4.4 3.5 - 5.1 mmol/L Final   Sodium  Date Value Ref Range Status  04/17/2024 132 (L) 135 - 145 mmol/L Final  04/17/2014 133 (L) 136 - 145 mmol/L Final   B-Type Natriuretic Peptide  Date Value Ref Range Status  04/17/2014 190 (H) 0 - 125 pg/mL Final   Magnesium   Date Value Ref Range Status  04/17/2024 2.5 (H) 1.7 - 2.4 mg/dL Final    Comment:    Performed at [facility], [address] [place], [place] [address]   Hemoglobin A1C  Date Value Ref Range Status  03/28/2014 7.1 (H) 4.2 - 6.3 % Final    Comment:    The American Diabetes Association recommends that a primary goal  of therapy should be <7% and that physicians should reevaluate the treatment regimen in patients with HbA1c values consistently >8%.    Hgb A1c MFr Bld  Date Value Ref Range Status  04/16/2024 8.0 (H) 4.8 - 5.6 % Final    Comment:    (NOTE) Diagnosis of Diabetes The following HbA1c ranges recommended by the American Diabetes Association (ADA) may be used as an aid in the diagnosis of diabetes mellitus.  Hemoglobin             Suggested A1C NGSP%              Diagnosis  <5.7                   Non Diabetic  5.7-6.4                Pre-Diabetic  >6.4                   Diabetic  <7.0                   Glycemic control for                       adults with diabetes.     TSH  Date Value Ref Range Status  04/15/2024 5.150 (H) 0.350 - 4.500 uIU/mL Final    Comment:    Performed at [facility], [address] [place], [place] [address]    Vital Signs: Temp:  [97.2 F (36.2 C)-98.2 F (36.8 C)] 97.8 F (36.6 C) (01/12 0434) Pulse Rate:  [70-140] 86 (01/12 0434) Cardiac Rhythm: Atrial fibrillation;Bundle branch block (01/11 1900) Resp:  [16-31] 20 (01/12 0434) BP: (98-155)/(56-96) 116/59 (01/12 0434) SpO2:  [92 %-100 %] 94 % (01/12 0434) Weight:  [85.3 kg (188 lb 0.8 oz)] 85.3 kg (188 lb 0.8 oz) (01/12 0500)  Intake/Output Summary (Last 24 hours) at 04/17/2024 0755 Last data filed at 04/17/2024 9390 Gross per 24 hour  Intake --  Output 600 ml  Net -600 ml    Current Heart Failure Medications:  Loop diuretic: none Beta-Blocker: metoprolol  succinate 25 mg PO TID ACEI/ARB/ARNI: none MRA: none SGLT2i: Jardiance  25 mg daily Other: digoxin   0.25 mg IV daily  Prior to admission Heart Failure Medications:  Loop diuretic: none Beta-Blocker: metoprolol  succinate 25 mg TID ACEI/ARB/ARNI: none MRA: none SGLT2i: Jardiance  25 mg daily Other: amlodipine  10 mg daily  Assessment: 1. Acute systolic heart failure (LVEF 30-35%) with mildly reduced RV function, due  to NICM. NYHA class III symptoms.  -Symptoms: Reports shortness of breath improved, however, patient was short of breath at rest after ambulating to the restroom. Patient's daughter reports this happens every time she moves due to anxiety. No LEE noted. Reports orthopnea. -Volume: Volume is difficult to assess. Admit BNP could be elevated due to AF. Received IV diuretics, but creatinine and BUN increased today. -Hemodynamics: Lactic acidosis on admission portraying shock. Suspect she remains at high risk of decompensation. BP is stable. HR stable despite supratherapeutic digoxin  level. Digoxin  discontinued. Patient is in a difficult situation as she may require inotropes given AKI with diuresis, but this would further exacerbate AF. -BB: Currently on metoprolol  succinate 25 mg TID. Rate is difficult to control. If patient decompensates again, she may require dose reduction despite HR. -ACEI/ARB/ARNI: Not appropriate at this time given AKI. Can consider adding prior to discharge. -MRA: Not appropriate at this time given AKI. Can consider adding prior to discharge. -SGLT2i: Continue Jardiance  25 mg daily  Plan: 1) Medication changes recommended at this time: -None at this time.   2) Patient assistance: -Pending  3) Education: - Patient has been educated on current HF medications and potential additions to HF medication regimen - Patient verbalizes understanding that over the next few months, these medication doses may change and more medications may be added to optimize HF regimen - Patient has been educated on basic disease state pathophysiology and goals of therapy  Please do not hesitate to reach out with questions or concerns,  Magdalyn Arenivas, PharmD, CPP, BCPS, [facility] Heart Failure Pharmacist  Phone - [phone] 04/17/2024 12:52 PM

## 2024-04-17 NOTE — Progress Notes (Signed)
 PT Cancellation Note  Patient Details Name: Mariem Skolnick MRN: [id number] DOB: 05-Sep-1955   Cancelled Treatment:    Reason Eval/Treat Not Completed: Other (comment). Pt finishing up with OT, just returned to bed after session. PT to re-attempt as able.    Doyal Shams PT, DPT 2:32 PM,04/17/2024

## 2024-04-17 NOTE — Progress Notes (Signed)
 " PROGRESS NOTE    Amber Stevenson  FMW:[id number] DOB: [birth year] DOA: 04/15/2024 PCP: Delfina Pao, MD  Chief Complaint  Patient presents with   Shortness of Breath    Hospital Course:  Amber Stevenson is a 69 y.o. female with medical history significant for depression, type II diabetes mellitus, hypertension, COPD, fibromyalgia, aortic insufficiency and hypothyroidism presented with DOE, hypoxia.  Was seen outpatient and was prescribed antibiotics and prednisone , felt better for couple days and symptoms continue to worsen.  Patient admitted for A-fib with RVR, acute heart failure, elevated troponin with new LBBB on EKG, pneumonia, COPD exacerbation.  Hospital course as below  Subjective: Patient was examined at bedside, daughter and grand daughter present Patient resting and family states would want her to sleep, was anxious earlier in the day Reports improvement in SOB  Seen by Cardiology and Nephrology   Objective: Vitals:   04/17/24 0808 04/17/24 0921 04/17/24 1240 04/17/24 1617  BP: 132/89  (!) 123/47   Pulse: 96     Resp: (!) 23     Temp: 98.2 F (36.8 C)  97.6 F (36.4 C)   TempSrc: Oral  Oral   SpO2: 98% 98%  98%  Weight:      Height:        Intake/Output Summary (Last 24 hours) at 04/17/2024 1907 Last data filed at 04/17/2024 9390 Gross per 24 hour  Intake --  Output 600 ml  Net -600 ml   Filed Weights   04/15/24 2214 04/17/24 0500  Weight: 82.6 kg 85.3 kg    Examination: GENERAL:  69 y.o.-year-old female patient lying in the bed with no acute distress.  NECK:  Supple, no jugular venous distention. No thyroid  enlargement, no tenderness.  LUNGS: Bibasilar breath sounds with bibasilar rales and diffuse expiratory wheezes with tight expiratory airflow and harsh vesicular breathing. No use of accessory muscles of respiration.  CARDIOVASCULAR: Tachycardic, irregular, S1, S2 normal. No murmurs, rubs, or gallops.  ABDOMEN: Soft, nondistended, nontender. Bowel  sounds present. No organomegaly or mass.  EXTREMITIES: No pedal edema, cyanosis, or clubbing.  NEUROLOGIC: AO x 3, no gross focal deficits SKIN: No obvious rash, lesion, or ulcer  Assessment & Plan:  Atrial fibrillation with RVR S/p IV digoxin , digoxin  level elevated - repeat tomorrow AM IV Amiodarone  S/p IV heparin , transition to Eliquis  5 mg bid Cardiology following, appreciate recs   Acute HFrEF, new diagnosis Echo shows EF 30-35%, global hypokinesis, moderate MR with echodensity of mitral valve chord, moderate to severe AR  Lasix  held. Continue Jardiance , metoprolol  25mg  Cardiology following, appreciate recs  Elevated troponin EKG with new LBBB Denies chest pain. 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 Cr 1.3 -> 1.21 -> 1.94, baseline Cr ~ 1.2 Hold diuresis US  renal pending Seen by Nephrology, appreciate recs Monitor creatinine  Hyperkalemia Potassium 6.4 -> 5.4 s/p treatment with temporizing measures One dose Lokelma  10g Repeat potassium   Uncontrolled type 2 diabetes mellitus with hyperglycemia, without long-term current use of insulin  Steroid Induced hyperglycemia HbA1c 8.0 Hold metformin, glipizide  SSI  Normocytic anemia Monitor Hb  HTN Hold amlodipine , resume as BP tolerates losartan  on hold due to AKI  Hypothyroidism TSH 5.15, FT4 0.93 Continue home levothyroxine   GERD without esophagitis PPI  Dyslipidemia Continue Crestor   Anxiety Bipolar disorder On Valium  as needed On carbamazepine  400 mg -> decreased to 200mg  daily, discussed with psychiatry. No hx of seizures Dose decreased due to interactions with Eliquis , did not want  to be on Coumadin  Obesity Class I Body mass index is 34.4 kg/m. Outpatient follow up for lifestyle modification and risk factor management  -- PT/OT eval   DVT prophylaxis: Eliquis    Code Status: Full Code Disposition:  TBD  Consultants:  Treatment Team:  Consulting Physician: Florencio Cara BIRCH, MD Nephrology  Procedures:  None  Antimicrobials:  Anti-infectives (From admission, onward)    Start     Dose/Rate Route Frequency Ordered Stop   04/16/24 1200  cefTRIAXone  (ROCEPHIN ) 2 g in sodium chloride  0.9 % 100 mL IVPB        2 g 200 mL/hr over 30 Minutes Intravenous Every 24 hours 04/16/24 0418 04/21/24 1159   04/16/24 1000  doxycycline  (VIBRA -TABS) tablet 100 mg        100 mg Oral 2 times daily 04/16/24 0418     04/16/24 0500  cefTRIAXone  (ROCEPHIN ) 2 g in sodium chloride  0.9 % 100 mL IVPB  Status:  Discontinued        2 g 200 mL/hr over 30 Minutes Intravenous Every 24 hours 04/16/24 0450 04/16/24 0454   04/16/24 0000  cefTRIAXone  (ROCEPHIN ) 1 g in sodium chloride  0.9 % 100 mL IVPB        1 g 200 mL/hr over 30 Minutes Intravenous  Once 04/15/24 2358 04/16/24 0042   04/16/24 0000  doxycycline  (VIBRAMYCIN ) 100 mg in sodium chloride  0.9 % 250 mL IVPB        100 mg 125 mL/hr over 120 Minutes Intravenous  Once 04/15/24 2358 04/16/24 0305       Data Reviewed: I have personally reviewed following labs and imaging studies CBC: Recent Labs  Lab 04/15/24 2220 04/16/24 0450 04/17/24 0451  WBC 14.6* 14.0* 17.8*  NEUTROABS 9.9*  --   --   HGB 11.1* 10.1* 10.5*  HCT 35.3* 32.2* 33.2*  MCV 92.2 92.0 90.0  PLT 350 321 321   Basic Metabolic Panel: Recent Labs  Lab 04/15/24 2220 04/16/24 0450 04/16/24 1013 04/16/24 1802 04/17/24 0018 04/17/24 0451  NA 132* 131*  --   --   --  132*  K 5.3* 5.7* 5.6* 5.6* 6.4* 5.4*  CL 97* 98  --   --   --  94*  CO2 19* 20*  --   --   --  21*  GLUCOSE 305* 364*  --   --   --  194*  BUN 47* 48*  --   --   --  67*  CREATININE 1.30*  1.21*  --   --   --  1.94*  CALCIUM  9.4 8.8*  --   --   --  8.9  MG 2.4  --   --   --   --  2.5*   GFR: Estimated Creatinine Clearance: 27.3 mL/min (A) (by C-G formula based on SCr of 1.94 mg/dL (H)). Liver Function Tests: Recent Labs  Lab 04/15/24 2220  AST 20  ALT 23  ALKPHOS 72  BILITOT 0.4  PROT 8.0  ALBUMIN 4.4   CBG: Recent Labs  Lab 04/16/24 1740 04/16/24 2107 04/17/24 0903 04/17/24 1322 04/17/24 1753  GLUCAP 223* 145* 285* 236* 142*    Recent Results (from the past 240 hours)  Resp panel by RT-PCR (RSV, Flu A&B, Covid) Anterior Nasal Swab  Status: None   Collection Time: 04/15/24 10:20 PM   Specimen: Anterior Nasal Swab  Result Value Ref Range Status   SARS Coronavirus 2 by RT PCR NEGATIVE NEGATIVE Final    Comment: (NOTE) SARS-CoV-2 target nucleic acids are NOT DETECTED.  The SARS-CoV-2 RNA is generally detectable in upper respiratory specimens during the acute phase of infection. The lowest concentration of SARS-CoV-2 viral copies this assay can detect is 138 copies/mL. A negative result does not preclude SARS-Cov-2 infection and should not be used as the sole basis for treatment or other patient management decisions. A negative result may occur with  improper specimen collection/handling, submission of specimen other than nasopharyngeal swab, presence of viral mutation(s) within the areas targeted by this assay, and inadequate number of viral copies(<138 copies/mL). A negative result must be combined with clinical observations, patient history, and epidemiological information. The expected result is Negative.  Fact Sheet for Patients:  bloggercourse.com  Fact Sheet for Healthcare Providers:  seriousbroker.it  This test is no t yet approved or cleared by the [place]  FDA and  has been authorized for detection and/or diagnosis of SARS-CoV-2 by FDA under an Emergency Use Authorization (EUA).  This EUA will remain  in effect (meaning this test can be used) for the duration of the COVID-19 declaration under Section 564(b)(1) of the Act, 21 U.S.C.section 360bbb-3(b)(1), unless the authorization is terminated  or revoked sooner.       Influenza A by PCR NEGATIVE NEGATIVE Final   Influenza B by PCR NEGATIVE NEGATIVE Final    Comment: (NOTE) The Xpert Xpress SARS-CoV-2/FLU/RSV plus assay is intended as an aid in the diagnosis of influenza from Nasopharyngeal swab specimens and should not be used as a sole basis for treatment. Nasal washings and aspirates are unacceptable for Xpert Xpress SARS-CoV-2/FLU/RSV testing.  Fact Sheet for Patients: bloggercourse.com  Fact Sheet for Healthcare Providers: seriousbroker.it  This test is not yet approved or cleared by the [place]  FDA and has been authorized for detection and/or diagnosis of SARS-CoV-2 by FDA under an Emergency Use Authorization (EUA). This EUA will remain in effect (meaning this test can be used) for the duration of the COVID-19 declaration under Section 564(b)(1) of the Act, 21 U.S.C. section 360bbb-3(b)(1), unless the authorization is terminated or revoked.     Resp Syncytial Virus by PCR NEGATIVE NEGATIVE Final    Comment: (NOTE) Fact Sheet for Patients: bloggercourse.com  Fact Sheet for Healthcare Providers: seriousbroker.it  This test is not yet approved or cleared by the [place]  FDA and has been authorized for detection and/or diagnosis of SARS-CoV-2 by FDA under an Emergency Use Authorization (EUA). This EUA will remain in effect (meaning this test can be used) for the duration of the COVID-19 declaration under Section 564(b)(1) of the Act, 21 U.S.C. section 360bbb-3(b)(1), unless the authorization is terminated or revoked.  Performed at [facility], [address]  [place], [place] [address]   Blood culture (routine x 2)     Status: None (Preliminary result)   Collection Time: 04/15/24 10:20 PM   Specimen: BLOOD  Result Value Ref Range Status   Specimen Description BLOOD BLOOD LEFT ARM  Final   Special Requests   Final    BOTTLES DRAWN AEROBIC AND ANAEROBIC Blood Culture adequate volume   Culture   Final    NO GROWTH 2 DAYS Performed at [facility], [address], [place], [place] [address]    Report Status PENDING  Incomplete  Blood culture (routine  x 2)     Status: None (Preliminary result)   Collection Time: 04/15/24 10:20 PM   Specimen: BLOOD  Result Value Ref Range Status   Specimen Description BLOOD BLOOD RIGHT FOREARM  Final   Special Requests   Final    BOTTLES DRAWN AEROBIC AND ANAEROBIC Blood Culture adequate volume   Culture   Final    NO GROWTH 2 DAYS Performed at [facility], [address] [place], [place] [address]    Report Status PENDING  Incomplete  Respiratory (~20 pathogens) panel by PCR     Status: None   Collection Time: 04/16/24  3:01 PM   Specimen: Nasopharyngeal Swab; Respiratory  Result Value Ref Range Status   Adenovirus NOT DETECTED NOT DETECTED Final   Coronavirus 229E NOT DETECTED NOT DETECTED Final    Comment: (NOTE) The Coronavirus on the Respiratory Panel, DOES NOT test for the novel  Coronavirus (2019 nCoV)    Coronavirus HKU1 NOT DETECTED NOT DETECTED Final   Coronavirus NL63 NOT DETECTED NOT DETECTED Final   Coronavirus OC43 NOT DETECTED NOT DETECTED Final   Metapneumovirus NOT DETECTED NOT DETECTED Final   Rhinovirus / Enterovirus NOT DETECTED NOT DETECTED Final   Influenza A NOT DETECTED NOT DETECTED Final   Influenza B NOT DETECTED NOT DETECTED Final   Parainfluenza Virus 1 NOT DETECTED NOT DETECTED Final   Parainfluenza Virus 2 NOT DETECTED NOT DETECTED Final   Parainfluenza Virus 3 NOT DETECTED NOT DETECTED Final   Parainfluenza Virus 4 NOT DETECTED NOT DETECTED Final    Respiratory Syncytial Virus NOT DETECTED NOT DETECTED Final   Bordetella pertussis NOT DETECTED NOT DETECTED Final   Bordetella Parapertussis NOT DETECTED NOT DETECTED Final   Chlamydophila pneumoniae NOT DETECTED NOT DETECTED Final   Mycoplasma pneumoniae NOT DETECTED NOT DETECTED Final    Comment: Performed at [facility], [address], [place], [place] [address]     Radiology Studies: ECHOCARDIOGRAM COMPLETE Result Date: 04/16/2024    ECHOCARDIOGRAM REPORT   Patient Name:   Marleta Manders Date of Exam: 04/16/2024 Medical Rec #:  [id number]       Height:       62.0 in Accession #:    [id number]      Weight:       182.0 lb Date of Birth:  1955-04-21       BSA:          1.837 m Patient Age:    68 years        BP:           156/70 mmHg Patient Gender: F               HR:           116 bpm. Exam Location:  Inpatient Procedure: 2D Echo, Color Doppler, Cardiac Doppler and Intracardiac            Opacification Agent (Both Spectral and Color Flow Doppler were            utilized during procedure). Indications:     CHF-Acute Diastolic  History:         Patient has no prior history of Echocardiogram examinations.                  Risk Factors:Hypertension, Diabetes, Former Smoker and Thyroid                   Disease.  Sonographer:     Logan Shove RDCS Referring  Phys:  [id number] [facility] Diagnosing Phys: Annabella Scarce MD  Sonographer Comments: Technically difficult study due to poor echo windows. IMPRESSIONS  1. Left ventricular ejection fraction, by estimation, is 30 to 35%. The left ventricle has moderately decreased function. The left ventricle demonstrates global hypokinesis. Left ventricular diastolic parameters are indeterminate.  2. Right ventricular systolic function is mildly reduced. The right ventricular size is normal. There is moderately elevated pulmonary artery systolic pressure.  3. Left atrial size was mildly dilated.  4. There is a round echodensity on the mitral valve chord measuring  0.8 x 0.5 cm. This likely represents calcification. Cannot rule out endocarditis. Consider TEE or cardiac MRI to better evaluate. The mitral valve is degenerative. Moderate mitral valve  regurgitation. No evidence of mitral stenosis. The mean mitral valve gradient is 3.3 mmHg with average heart rate of 119 bpm.  5. Tricuspid valve regurgitation is mild to moderate.  6. The aortic valve is normal in structure. Aortic valve regurgitation is moderate to severe. No aortic stenosis is present. Aortic regurgitation PHT measures 237 msec.  7. The inferior vena cava is dilated in size with <50% respiratory variability, suggesting right atrial pressure of 15 mmHg. FINDINGS  Left Ventricle: Left ventricular ejection fraction, by estimation, is 30 to 35%. The left ventricle has moderately decreased function. The left ventricle demonstrates global hypokinesis. Definity  contrast agent was given IV to delineate the left ventricular endocardial borders. The left ventricular internal cavity size was normal in size. There is no left ventricular hypertrophy. Left ventricular diastolic parameters are indeterminate. Right Ventricle: The right ventricular size is normal. No increase in right ventricular wall thickness. Right ventricular systolic function is mildly reduced. There is moderately elevated pulmonary artery systolic pressure. The tricuspid regurgitant velocity is 3.34 m/s, and with an assumed right atrial pressure of 15 mmHg, the estimated right ventricular systolic pressure is 59.6 mmHg. Left Atrium: Left atrial size was mildly dilated. Right Atrium: Right atrial size was normal in size. Pericardium: There is no evidence of pericardial effusion. Mitral Valve: There is a round echodensity on the mitral valve chord measuring 0.8 x 0.5 cm. This likely represents calcification. Cannot rule out endocarditis. Consider TEE or cardiac MRI to better evaluate. The mitral valve is degenerative in appearance. There is moderate  thickening of the mitral valve leaflet(s). There is moderate calcification of the mitral valve leaflet(s). Moderate mitral valve regurgitation. No evidence of mitral valve stenosis. The mean mitral valve gradient is 3.3 mmHg  with average heart rate of 119 bpm. Tricuspid Valve: The tricuspid valve is normal in structure. Tricuspid valve regurgitation is mild to moderate. No evidence of tricuspid stenosis. Aortic Valve: The aortic valve is normal in structure. Aortic valve regurgitation is moderate to severe. Aortic regurgitation PHT measures 237 msec. No aortic stenosis is present. Aortic valve peak gradient measures 13.5 mmHg. Pulmonic Valve: The pulmonic valve was normal in structure. Pulmonic valve regurgitation is not visualized. No evidence of pulmonic stenosis. Aorta: The aortic root is normal in size and structure. Venous: The inferior vena cava is dilated in size with less than 50% respiratory variability, suggesting right atrial pressure of 15 mmHg. IAS/Shunts: No atrial level shunt detected by color flow Doppler.  LEFT VENTRICLE PLAX 2D LVIDd:         4.10 cm LVIDs:         3.30 cm LV PW:         0.90 cm LV IVS:        0.80  cm LVOT diam:     1.90 cm LVOT Area:     2.84 cm  LV Volumes (MOD) LV vol d, MOD A2C: 143.0 ml LV vol d, MOD A4C: 106.0 ml LV vol s, MOD A2C: 92.9 ml LV vol s, MOD A4C: 67.7 ml LV SV MOD A2C:     50.1 ml LV SV MOD A4C:     106.0 ml LV SV MOD BP:      42.2 ml RIGHT VENTRICLE            IVC RV Basal diam:  3.50 cm    IVC diam: 2.00 cm RV S prime:     7.34 cm/s TAPSE (M-mode): 1.8 cm LEFT ATRIUM             Index        RIGHT ATRIUM           Index LA diam:        3.80 cm 2.07 cm/m   RA Area:     17.40 cm LA Vol (A2C):   88.7 ml 48.30 ml/m  RA Volume:   45.00 ml  24.50 ml/m LA Vol (A4C):   31.7 ml 17.26 ml/m LA Biplane Vol: 54.4 ml 29.62 ml/m  AORTIC VALVE AV Area (Vmax): 1.53 cm AV Vmax:        183.60 cm/s AV Peak Grad:   13.5 mmHg LVOT Vmax:      98.98 cm/s AI PHT:         237  msec  AORTA Ao Root diam: 2.30 cm Ao Asc diam:  3.20 cm MITRAL VALVE                  TRICUSPID VALVE MV Mean grad: 3.3 mmHg        TR Peak grad:   44.6 mmHg MR Peak grad:    90.6 mmHg    TR Mean grad:   26.0 mmHg MR Mean grad:    56.0 mmHg    TR Vmax:        334.00 cm/s MR Vmax:         476.00 cm/s  TR Vmean:       232.0 cm/s MR Vmean:        350.0 cm/s MR PISA:         5.09 cm     SHUNTS MR PISA Eff ROA: 41 mm       Systemic Diam: 1.90 cm MR PISA Radius:  0.90 cm Annabella Scarce MD Electronically signed by Annabella Scarce MD Signature Date/Time: 04/16/2024/12:54:51 PM    Final    DG Chest Portable 1 View Result Date: 04/15/2024 CLINICAL DATA:  Short of breath EXAM: PORTABLE CHEST 1 VIEW COMPARISON:  02/11/2021 FINDINGS: Single frontal view of the chest demonstrates an enlarged cardiac silhouette. Bibasilar veiling opacities, right greater than left, consistent with consolidation and/or effusions. No pneumothorax. No acute bony abnormalities. IMPRESSION: 1. Constellation of findings suggesting congestive heart failure, with bibasilar edema and effusions. Electronically Signed   By: Ozell Daring M.D.   On: 04/15/2024 23:06    Scheduled Meds:  apixaban   5 mg Oral BID   budesonide  (PULMICORT ) nebulizer solution  0.25 mg Nebulization BID   carbamazepine   200 mg Oral Daily   cholecalciferol   2,000 Units Oral Daily   diazepam   2 mg Oral Once   doxycycline   100 mg Oral BID   DULoxetine   60 mg Oral Daily   empagliflozin   25 mg Oral Daily  guaiFENesin   600 mg Oral BID   insulin  aspart  0-15 Units Subcutaneous TID WC   insulin  aspart  0-5 Units Subcutaneous QHS   levothyroxine   112 mcg Oral Q0600   methylPREDNISolone  (SOLU-MEDROL ) injection  40 mg Intravenous Q12H   metoprolol  succinate  25 mg Oral TID   pantoprazole   40 mg Oral Daily   rosuvastatin   40 mg Oral Daily   saccharomyces boulardii  250 mg Oral BID   Continuous Infusions:  amiodarone  30 mg/hr (04/17/24 1043)   cefTRIAXone   (ROCEPHIN )  IV 2 g (04/17/24 1232)     LOS: 1 day  MDM: Patient is high risk for one or more organ failure.  They necessitate ongoing hospitalization for continued IV therapies and subsequent lab monitoring. Total time spent interpreting labs and vitals, reviewing the medical record, coordinating care amongst consultants and care team members, directly assessing and discussing care with the patient and/or family: 55 min Laree Lock, MD [facility]  To contact the attending physician between 7A-7P please use Epic Chat. To contact the covering physician during after hours 7P-7A, please review Amion.  04/17/2024, 7:07 PM   *This document has been created with the assistance of dictation software. Please excuse typographical errors. *   "

## 2024-04-17 NOTE — Progress Notes (Signed)
 ANTICOAGULATION CONSULT NOTE  Pharmacy Consult for heparin  infusion Indication: atrial fibrillation  Allergies[1]  Patient Measurements: Height: 5' 2 (157.5 cm) Weight: 85.3 kg (188 lb 0.8 oz) IBW/kg (Calculated) : 50.1 HEPARIN  DW (KG): 68.6  Vital Signs: Temp: 97.8 F (36.6 C) (01/12 0434) BP: 116/59 (01/12 0434) Pulse Rate: 86 (01/12 0434)  Labs: Recent Labs    04/15/24 2220 04/16/24 0023 04/16/24 0450 04/16/24 1013 04/16/24 1842 04/17/24 0451  HGB 11.1*  --  10.1*  --   --  10.5*  HCT 35.3*  --  32.2*  --   --  33.2*  PLT 350  --  321  --   --  321  APTT  --  27 136*  --   --   --   LABPROT  --  15.6* 15.9* 15.5*  --   --   INR  --  1.2 1.2 1.2  --   --   HEPARINUNFRC  --   --   --  0.41 0.38 0.38  CREATININE 1.30*  --  1.21*  --   --  1.94*    Estimated Creatinine Clearance: 27.3 mL/min (A) (by C-G formula based on SCr of 1.94 mg/dL (H)).   Medical History: Past Medical History:  Diagnosis Date   Arthritis    Depression    Diabetes mellitus without complication (HCC)    Fibromyalgia    Hypertension    Thyroid  disease     Assessment: Pt is a 69 yo female presenting to ED c/o SOB, found to be in afib RVR (rate 140-160s).  Goal of Therapy:  Heparin  level 0.3-0.7 units/ml Monitor platelets by anticoagulation protocol: Yes  1/11 1013 HL 0.41  therapeutic x 1 1/11 1955 HL 0.38  therapeutic x 2 1/12 0451 HL 0.38, therapeutic x 3   Plan:  Continue heparin  infusion at 900 units/hr Recheck HL daily with AM labs while therapeutic CBC daily while on heparin   Rankin CANDIE Dills, PharmD, [facility] 04/17/2024 5:58 AM       [1]  Allergies Allergen Reactions   Augmentin [Amoxicillin-Pot Clavulanate] Diarrhea    Has patient had a PCN reaction causing immediate rash, facial/tongue/throat swelling, SOB or lightheadedness with hypotension: No Has patient had a PCN reaction causing severe rash involving mucus membranes or skin necrosis: No Has patient had a PCN  reaction that required hospitalization: No Has patient had a PCN reaction occurring within the last 10 years: No If all of the above answers are NO, then may proceed with Cephalosporin use.   Gabapentin Other (See Comments)    High doses led to falls   Lisinopril Other (See Comments)    Reaction: Blood pressure

## 2024-04-17 NOTE — Evaluation (Signed)
 Occupational Therapy Evaluation Patient Details Name: Amber Stevenson MRN: [id number] DOB: 11-29-1955 Today's Date: 04/17/2024   History of Present Illness   Pt is a 69 y.o. female with depression, type II diabetes mellitus, hypertension, COPD, fibromyalgia, aortic insufficiency and hypothyroidism  who presented with worsening dyspnea. Chest x-ray noted findings suggesting congestive heart failure.     Clinical Impressions Chart reviewed to date, pt greeted semi supine in bed, agreeable to OT evaluation with encouragement. She reports she gets SOB/dizzy with any position changes. PTA pt is MOD I-I in ADL/IADL, amb with no AD household distances. Pt presents with deficits in strength, endurance activity tolerance, balance, affecting safe and optimal ADL completion. MIN-MOD A required for bed mobility, MIN A for STS via HHA, lateral steps up the bed with MIN A via HHA. Pt reports dizziness, SOB, requests return to bed.Pt is performing ADL/functional mobility below PLOF, will benefit from acute OT to address functional deficits and to facilitate optimal ADL/functional mobility engagement. Pt is left as received, all needs met. OT will follow.      If plan is discharge home, recommend the following:   A little help with walking and/or transfers;A little help with bathing/dressing/bathroom;Help with stairs or ramp for entrance;Assistance with cooking/housework     Functional Status Assessment   Patient has had a recent decline in their functional status and demonstrates the ability to make significant improvements in function in a reasonable and predictable amount of time.     Equipment Recommendations   BSC/3in1;Tub/shower bench (2WW)     Recommendations for Other Services         Precautions/Restrictions   Precautions Precautions: Fall Recall of Precautions/Restrictions: Impaired Restrictions Weight Bearing Restrictions Per Provider Order: No     Mobility Bed  Mobility Overal bed mobility: Needs Assistance Bed Mobility: Supine to Sit, Sit to Supine     Supine to sit: Mod assist, HOB elevated, Used rails Sit to supine: Min assist        Transfers Overall transfer level: Needs assistance Equipment used: 1 person hand held assist Transfers: Sit to/from Stand Sit to Stand: Min assist           General transfer comment: lateral steps up the bed with MIN A      Balance Overall balance assessment: Needs assistance Sitting-balance support: Feet supported Sitting balance-Leahy Scale: Good     Standing balance support: Single extremity supported, During functional activity, Reliant on assistive device for balance Standing balance-Leahy Scale: Fair                             ADL either performed or assessed with clinical judgement   ADL Overall ADL's : Needs assistance/impaired     Grooming: Set up;Sitting               Lower Body Dressing: Minimal assistance Lower Body Dressing Details (indicate cue type and reason): donn/doff slippers Toilet Transfer: Minimal assistance;Cueing for sequencing Toilet Transfer Details (indicate cue type and reason): simulated via HHA                 Vision Patient Visual Report: No change from baseline       Perception         Praxis         Pertinent Vitals/Pain Pain Assessment Pain Assessment: No/denies pain     Extremity/Trunk Assessment Upper Extremity Assessment Upper Extremity Assessment: Overall WFL for tasks assessed   Lower  Extremity Assessment Lower Extremity Assessment: Generalized weakness       Communication Communication Communication: No apparent difficulties   Cognition Arousal: Alert Behavior During Therapy: Anxious, WFL for tasks assessed/performed Cognition: Cognition impaired           Executive functioning impairment (select all impairments): Problem solving OT - Cognition Comments: will continue to assess                  Following commands: Intact       Cueing  General Comments   Cueing Techniques: Verbal cues  vss on 2L via [place]; pt reports dizziness with position changes, improved with time   Exercises Other Exercises Other Exercises: edu pt/family re role of OT, role of rehab, discharge recommendations   Shoulder Instructions      Home Living Family/patient expects to be discharged to:: Private residence Living Arrangements: Children (son, grand daugthe) Available Help at Discharge: Family;Available 24 hours/day Type of Home: Apartment Home Access: Stairs to enter Entrance Stairs-Number of Steps: 1 step, ramp   Home Layout: One level     Bathroom Shower/Tub: Chief Strategy Officer: Handicapped height Bathroom Accessibility: Yes   Home Equipment: Cane - quad   Additional Comments: pt reports she has a RW from 20+ yrs ago      Prior Functioning/Environment Prior Level of Function : Independent/Modified Independent             Mobility Comments: amb with no AD ADLs Comments: generally MOD I with ADLs, grand daugther helps with step into the shower; assist for IADLs as needed    OT Problem List: Decreased strength;Decreased activity tolerance;Decreased knowledge of use of DME or AE;Impaired balance (sitting and/or standing);Decreased safety awareness;Cardiopulmonary status limiting activity   OT Treatment/Interventions: Self-care/ADL training;Therapeutic exercise;Energy conservation;DME and/or AE instruction;Therapeutic activities;Balance training;Modalities;Patient/family education      OT Goals(Current goals can be found in the care plan section)   Acute Rehab OT Goals Patient Stated Goal: go home OT Goal Formulation: With patient/family Time For Goal Achievement: 05/01/24 Potential to Achieve Goals: Fair ADL Goals Pt Will Perform Grooming: with modified independence;sitting;standing Pt Will Perform Lower Body Dressing: with modified  independence;sitting/lateral leans;sit to/from stand Pt Will Transfer to Toilet: with modified independence;ambulating Pt Will Perform Toileting - Clothing Manipulation and hygiene: with modified independence;sitting/lateral leans;sit to/from stand   OT Frequency:  Min 2X/week    Co-evaluation              AM-PAC OT 6 Clicks Daily Activity     Outcome Measure Help from another person eating meals?: None Help from another person taking care of personal grooming?: None Help from another person toileting, which includes using toliet, bedpan, or urinal?: A Little Help from another person bathing (including washing, rinsing, drying)?: A Little Help from another person to put on and taking off regular upper body clothing?: None Help from another person to put on and taking off regular lower body clothing?: A Little 6 Click Score: 21   End of Session Equipment Utilized During Treatment: Oxygen Nurse Communication: Mobility status  Activity Tolerance: Patient tolerated treatment well Patient left: in bed;with call bell/phone within reach;with bed alarm set;with family/visitor present  OT Visit Diagnosis: Other abnormalities of gait and mobility (R26.89);Muscle weakness (generalized) (M62.81)                Time: 8592-8569 OT Time Calculation (min): 23 min Charges:  OT General Charges $OT Visit: 1 Visit OT Evaluation $OT Eval Moderate Complexity:  1 Mod  Therisa Sheffield, [place] OTR/L  04/17/2024, 2:43 PM

## 2024-04-17 NOTE — Progress Notes (Signed)
 [facility] CLINIC CARDIOLOGY PROGRESS NOTE   Patient ID: Amber Stevenson MRN: [id number] DOB/AGE: [birth year] 69 y.o.  Admit date: 04/15/2024 Referring Physician Dr. Madison Peaches Primary Physician Delfina Pao, MD  Primary Cardiologist Dr. Ammon (last seen 2022) Reason for Consultation wide-complex tachycardia, acute heart failure  HPI: [name] is a 69 y.o. female with a past medical history of hypertension, COPD, type 2 diabetes, depression who presented to the ED on 04/15/2024 for worsening shortness of breath.  Initial EKG with wide-complex tachycardia, A-fib with aberrancy.  Cardiology was consulted for further evaluation.   Interval History:  - Patient seen and examined this morning, resting comfortably in hospital bed with husband at bedside. - Feeling about the same today with stable shortness of breath. - Creatinine uptrending limiting further diuresis.  Review of systems complete and found to be negative unless listed above   Vitals:   04/17/24 0434 04/17/24 0500 04/17/24 0808 04/17/24 0921  BP: (!) 116/59  132/89   Pulse: 86  96   Resp: 20  (!) 23   Temp: 97.8 F (36.6 C)  98.2 F (36.8 C)   TempSrc:   Oral   SpO2: 94%  98% 98%  Weight:  85.3 kg    Height:         Intake/Output Summary (Last 24 hours) at 04/17/2024 1126 Last data filed at 04/17/2024 9390 Gross per 24 hour  Intake --  Output 600 ml  Net -600 ml     PHYSICAL EXAM General: Chronically ill appearing female, well nourished, in no acute distress. HEENT: Normocephalic and atraumatic. Neck: No JVD.  Lungs: Normal respiratory effort on 4L [place]. Clear bilaterally to auscultation. No wheezes, crackles, rhonchi.  Heart: Irregularly irregular, controlled rate. Normal S1 and S2 without gallops or murmurs. Radial & DP pulses 2+ bilaterally. Abdomen: Non-distended appearing.  Msk: Normal strength and tone for age. Extremities: No clubbing, cyanosis or edema.   Neuro: Alert and oriented X 3. Psych:  Mood appropriate, affect congruent.    LABS: Basic Metabolic Panel: Recent Labs    04/15/24 2220 04/16/24 0450 04/16/24 1013 04/17/24 0018 04/17/24 0451  NA 132* 131*  --   --  132*  K 5.3* 5.7*   < > 6.4* 5.4*  CL 97* 98  --   --  94*  CO2 19* 20*  --   --  21*  GLUCOSE 305* 364*  --   --  194*  BUN 47* 48*  --   --  67*  CREATININE 1.30* 1.21*  --   --  1.94*  CALCIUM  9.4 8.8*  --   --  8.9  MG 2.4  --   --   --  2.5*   < > = values in this interval not displayed.   Liver Function Tests: Recent Labs    04/15/24 2220  AST 20  ALT 23  ALKPHOS 72  BILITOT 0.4  PROT 8.0  ALBUMIN 4.4   No results for input(s): LIPASE, AMYLASE in the last 72 hours. CBC: Recent Labs    04/15/24 2220 04/16/24 0450 04/17/24 0451  WBC 14.6* 14.0* 17.8*  NEUTROABS 9.9*  --   --   HGB 11.1* 10.1* 10.5*  HCT 35.3* 32.2* 33.2*  MCV 92.2 92.0 90.0  PLT 350 321 321   Cardiac Enzymes: No results for input(s): CKTOTAL, CKMB, CKMBINDEX, TROPONINIHS in the last 72 hours. BNP: No results for input(s): BNP in the last 72 hours. D-Dimer: No results for input(s): DDIMER in the last  72 hours. Hemoglobin A1C: Recent Labs    04/16/24 0449  HGBA1C 8.0*   Fasting Lipid Panel: No results for input(s): CHOL, HDL, LDLCALC, TRIG, CHOLHDL, LDLDIRECT in the last 72 hours. Thyroid  Function Tests: Recent Labs    04/15/24 2220  TSH 5.150*   Anemia Panel: No results for input(s): VITAMINB12, FOLATE, FERRITIN, TIBC, IRON, RETICCTPCT in the last 72 hours.  ECHOCARDIOGRAM COMPLETE Result Date: 04/16/2024    ECHOCARDIOGRAM REPORT   Patient Name:   Amber Stevenson Date of Exam: 04/16/2024 Medical Rec #:  [id number]       Height:       62.0 in Accession #:    [id number]      Weight:       182.0 lb Date of Birth:  [birth year]       BSA:          1.837 m Patient Age:    69 years        BP:           156/70 mmHg Patient Gender: F               HR:           116 bpm. Exam  Location:  Inpatient Procedure: 2D Echo, Color Doppler, Cardiac Doppler and Intracardiac            Opacification Agent (Both Spectral and Color Flow Doppler were            utilized during procedure). Indications:     CHF-Acute Diastolic  History:         Patient has no prior history of Echocardiogram examinations.                  Risk Factors:Hypertension, Diabetes, Former Smoker and Thyroid                   Disease.  Sonographer:     Logan Shove RDCS Referring Phys:  [id number] JAN A MANSY Diagnosing Phys: Annabella Scarce MD  Sonographer Comments: Technically difficult study due to poor echo windows. IMPRESSIONS  1. Left ventricular ejection fraction, by estimation, is 30 to 35%. The left ventricle has moderately decreased function. The left ventricle demonstrates global hypokinesis. Left ventricular diastolic parameters are indeterminate.  2. Right ventricular systolic function is mildly reduced. The right ventricular size is normal. There is moderately elevated pulmonary artery systolic pressure.  3. Left atrial size was mildly dilated.  4. There is a round echodensity on the mitral valve chord measuring 0.8 x 0.5 cm. This likely represents calcification. Cannot rule out endocarditis. Consider TEE or cardiac MRI to better evaluate. The mitral valve is degenerative. Moderate mitral valve  regurgitation. No evidence of mitral stenosis. The mean mitral valve gradient is 3.3 mmHg with average heart rate of 119 bpm.  5. Tricuspid valve regurgitation is mild to moderate.  6. The aortic valve is normal in structure. Aortic valve regurgitation is moderate to severe. No aortic stenosis is present. Aortic regurgitation PHT measures 237 msec.  7. The inferior vena cava is dilated in size with <50% respiratory variability, suggesting right atrial pressure of 15 mmHg. FINDINGS  Left Ventricle: Left ventricular ejection fraction, by estimation, is 30 to 35%. The left ventricle has moderately decreased function. The left  ventricle demonstrates global hypokinesis. Definity  contrast agent was given IV to delineate the left ventricular endocardial borders. The left ventricular internal cavity size was normal in size. There is no left ventricular hypertrophy. Left ventricular diastolic parameters  are indeterminate. Right Ventricle: The right ventricular size is normal. No increase in right ventricular wall thickness. Right ventricular systolic function is mildly reduced. There is moderately elevated pulmonary artery systolic pressure. The tricuspid regurgitant velocity is 3.34 m/s, and with an assumed right atrial pressure of 15 mmHg, the estimated right ventricular systolic pressure is 59.6 mmHg. Left Atrium: Left atrial size was mildly dilated. Right Atrium: Right atrial size was normal in size. Pericardium: There is no evidence of pericardial effusion. Mitral Valve: There is a round echodensity on the mitral valve chord measuring 0.8 x 0.5 cm. This likely represents calcification. Cannot rule out endocarditis. Consider TEE or cardiac MRI to better evaluate. The mitral valve is degenerative in appearance. There is moderate thickening of the mitral valve leaflet(s). There is moderate calcification of the mitral valve leaflet(s). Moderate mitral valve regurgitation. No evidence of mitral valve stenosis. The mean mitral valve gradient is 3.3 mmHg  with average heart rate of 119 bpm. Tricuspid Valve: The tricuspid valve is normal in structure. Tricuspid valve regurgitation is mild to moderate. No evidence of tricuspid stenosis. Aortic Valve: The aortic valve is normal in structure. Aortic valve regurgitation is moderate to severe. Aortic regurgitation PHT measures 237 msec. No aortic stenosis is present. Aortic valve peak gradient measures 13.5 mmHg. Pulmonic Valve: The pulmonic valve was normal in structure. Pulmonic valve regurgitation is not visualized. No evidence of pulmonic stenosis. Aorta: The aortic root is normal in size and  structure. Venous: The inferior vena cava is dilated in size with less than 50% respiratory variability, suggesting right atrial pressure of 15 mmHg. IAS/Shunts: No atrial level shunt detected by color flow Doppler.  LEFT VENTRICLE PLAX 2D LVIDd:         4.10 cm LVIDs:         3.30 cm LV PW:         0.90 cm LV IVS:        0.80 cm LVOT diam:     1.90 cm LVOT Area:     2.84 cm  LV Volumes (MOD) LV vol d, MOD A2C: 143.0 ml LV vol d, MOD A4C: 106.0 ml LV vol s, MOD A2C: 92.9 ml LV vol s, MOD A4C: 67.7 ml LV SV MOD A2C:     50.1 ml LV SV MOD A4C:     106.0 ml LV SV MOD BP:      42.2 ml RIGHT VENTRICLE            IVC RV Basal diam:  3.50 cm    IVC diam: 2.00 cm RV S prime:     7.34 cm/s TAPSE (M-mode): 1.8 cm LEFT ATRIUM             Index        RIGHT ATRIUM           Index LA diam:        3.80 cm 2.07 cm/m   RA Area:     17.40 cm LA Vol (A2C):   88.7 ml 48.30 ml/m  RA Volume:   45.00 ml  24.50 ml/m LA Vol (A4C):   31.7 ml 17.26 ml/m LA Biplane Vol: 54.4 ml 29.62 ml/m  AORTIC VALVE AV Area (Vmax): 1.53 cm AV Vmax:        183.60 cm/s AV Peak Grad:   13.5 mmHg LVOT Vmax:      98.98 cm/s AI PHT:         237 msec  AORTA Ao Root diam: 2.30  cm Ao Asc diam:  3.20 cm MITRAL VALVE                  TRICUSPID VALVE MV Mean grad: 3.3 mmHg        TR Peak grad:   44.6 mmHg MR Peak grad:    90.6 mmHg    TR Mean grad:   26.0 mmHg MR Mean grad:    56.0 mmHg    TR Vmax:        334.00 cm/s MR Vmax:         476.00 cm/s  TR Vmean:       232.0 cm/s MR Vmean:        350.0 cm/s MR PISA:         5.09 cm     SHUNTS MR PISA Eff ROA: 41 mm       Systemic Diam: 1.90 cm MR PISA Radius:  0.90 cm Annabella Scarce MD Electronically signed by Annabella Scarce MD Signature Date/Time: 04/16/2024/12:54:51 PM    Final    DG Chest Portable 1 View Result Date: 04/15/2024 CLINICAL DATA:  Short of breath EXAM: PORTABLE CHEST 1 VIEW COMPARISON:  02/11/2021 FINDINGS: Single frontal view of the chest demonstrates an enlarged cardiac silhouette. Bibasilar  veiling opacities, right greater than left, consistent with consolidation and/or effusions. No pneumothorax. No acute bony abnormalities. IMPRESSION: 1. Constellation of findings suggesting congestive heart failure, with bibasilar edema and effusions. Electronically Signed   By: Ozell Daring M.D.   On: 04/15/2024 23:06     ECHO as above  TELEMETRY (personally reviewed): atrial fibrillation LBBB rate 80-90s  EKG (personally reviewed): Atrial fibrillation RVR LBBB rate 150 bpm  DATA reviewed by me 04/17/2024: last 24h vitals tele labs imaging I/O, hospitalist progress note  Principal Problem:   Atrial fibrillation with RVR (HCC) Active Problems:   Acute CHF (congestive heart failure) (HCC)   CAP (community acquired pneumonia)   Dyslipidemia   AKI (acute kidney injury)   Uncontrolled type 2 diabetes mellitus with hyperglycemia, without long-term current use of insulin  (HCC)   GERD without esophagitis   COPD with acute exacerbation (HCC)    ASSESSMENT AND PLAN: Glady Ouderkirk is a 69 y.o. female with a past medical history of hypertension, COPD, type 2 diabetes, depression who presented to the ED on 04/15/2024 for worsening shortness of breath.  Initial EKG with wide-complex tachycardia, A-fib with aberrancy.  Cardiology was consulted for further evaluation.   # Atrial fibrillation RVR with abberancy # Acute HFrEF # Community acquired pneumonia # Acute COPD exacerbation # AKI on CKD Patient presented with worsening shortness of breath, also found to be in wide-complex tachycardia on EKG which appears to be atrial fibrillation with aberrancy.  Rate overall improved with IV amiodarone  and digoxin .  Echo this admission with a EF 30-35%, global hypokinesis, moderate MR with echodensity of mitral valve chord, moderate to severe AR. - Continue IV amiodarone  infusion. - Continue IV digoxin  for today.  Will check dig level in the a.m. and decide on further dosing. - Will transition heparin  to  Eliquis  5 mg twice daily.  This plan was discussed with pharmacy as she is currently on carbamazepine  which has a DDI.  Primary team discussed with psych who advised it was ok to decrease carbamazepine  dose.  - Holding off on further IV diuresis.  Nephrology has been consulted, appreciate their evaluation. - Continue Jardiance  25 mg daily, metoprolol  succinate 25 mg 3 times daily. - Continue Crestor  40 mg daily.  This patient's case was discussed and created with Dr. Florencio and he is in agreement.  Signed:  Danita Bloch, PA-C  04/17/2024, 11:26 AM [facility] Cardiology

## 2024-04-17 NOTE — Plan of Care (Signed)
 VSS. 2L [place]. Patient remains on amio gtt. Family at bedside this shift. Renal ultrasound done this shift. See results. Patient has DOE.  Problem: Education: Goal: Ability to describe self-care measures that may prevent or decrease complications (Diabetes Survival Skills Education) will improve Outcome: Progressing Goal: Individualized Educational Video(s) Outcome: Progressing   Problem: Coping: Goal: Ability to adjust to condition or change in health will improve Outcome: Progressing   Problem: Fluid Volume: Goal: Ability to maintain a balanced intake and output will improve Outcome: Progressing   Problem: Health Behavior/Discharge Planning: Goal: Ability to identify and utilize available resources and services will improve Outcome: Progressing Goal: Ability to manage health-related needs will improve Outcome: Progressing   Problem: Metabolic: Goal: Ability to maintain appropriate glucose levels will improve Outcome: Progressing   Problem: Nutritional: Goal: Maintenance of adequate nutrition will improve Outcome: Progressing Goal: Progress toward achieving an optimal weight will improve Outcome: Progressing   Problem: Skin Integrity: Goal: Risk for impaired skin integrity will decrease Outcome: Progressing   Problem: Tissue Perfusion: Goal: Adequacy of tissue perfusion will improve Outcome: Progressing   Problem: Fluid Volume: Goal: Hemodynamic stability will improve Outcome: Progressing   Problem: Clinical Measurements: Goal: Diagnostic test results will improve Outcome: Progressing Goal: Signs and symptoms of infection will decrease Outcome: Progressing   Problem: Respiratory: Goal: Ability to maintain adequate ventilation will improve Outcome: Progressing   Problem: Education: Goal: Knowledge of General Education information will improve Description: Including pain rating scale, medication(s)/side effects and non-pharmacologic comfort measures Outcome:  Progressing   Problem: Health Behavior/Discharge Planning: Goal: Ability to manage health-related needs will improve Outcome: Progressing   Problem: Clinical Measurements: Goal: Ability to maintain clinical measurements within normal limits will improve Outcome: Progressing Goal: Will remain free from infection Outcome: Progressing Goal: Diagnostic test results will improve Outcome: Progressing Goal: Respiratory complications will improve Outcome: Progressing Goal: Cardiovascular complication will be avoided Outcome: Progressing   Problem: Activity: Goal: Risk for activity intolerance will decrease Outcome: Progressing   Problem: Nutrition: Goal: Adequate nutrition will be maintained Outcome: Progressing   Problem: Coping: Goal: Level of anxiety will decrease Outcome: Progressing   Problem: Elimination: Goal: Will not experience complications related to bowel motility Outcome: Progressing Goal: Will not experience complications related to urinary retention Outcome: Progressing   Problem: Pain Managment: Goal: General experience of comfort will improve and/or be controlled Outcome: Progressing   Problem: Safety: Goal: Ability to remain free from injury will improve Outcome: Progressing   Problem: Skin Integrity: Goal: Risk for impaired skin integrity will decrease Outcome: Progressing

## 2024-04-17 NOTE — Telephone Encounter (Signed)
 Pharmacy Patient Advocate Encounter  Insurance verification completed.    The patient is insured through [employer]. Patient has Medicare and is not eligible for a copay card, but may be able to apply for patient assistance or Medicare RX Payment Plan (Patient Must reach out to their plan, if eligible for payment plan), if available.    Ran test claim for Eliquis  5mg  tablet and the current 30 day co-pay is $12.65.   This test claim was processed through [facility]- copay amounts may vary at other pharmacies due to pharmacy/plan contracts, or as the patient moves through the different stages of their insurance plan.

## 2024-04-17 NOTE — Consult Note (Signed)
 " [facility]  CONSULT NOTE    Date: 04/17/2024                  Patient Name:  Amber Stevenson  MRN: [id number]  DOB: [birth year]  Age / Sex: 69 y.o., female         PCP: Delfina Pao, MD                 Service Requesting Consult: Cardiology                 Reason for Consult: Acute kidney injury            History of Present Illness: [name] is a 69 y.o.  female with past medical conditions including hypertension, diabetes type 2, fibromyalgia, COPD, aortic insufficiency, and hypothyroidism, chronic kidney disease stage II, who was admitted to [facility] on 04/15/2024 for Bronchospasm [J98.01] Left bundle branch block [I44.7] Atrial fibrillation with rapid ventricular response (HCC) [I48.91] Atrial fibrillation with RVR (HCC) [I48.91] TSH elevation [R79.89] Congestive heart failure, unspecified HF chronicity, unspecified heart failure type [facility]) [I50.9]  Patient initially presented to emergency department with complaints of shortness of breath.  Found to be in A-fib with RVR.  Patient also being treated for acute CHF with IV furosemide .  Patient has been treated with IV furosemide , varying dosages, since admission.  Also placed on amiodarone  drip for rate control.  Labs on ED arrival concerning for sodium 132, potassium 5.3, serum bicarb 19, glucose 305, BUN 47, creatinine 1.3 with GFR 44, and BNP 6481, lactic acid 4.0, white count 14.6 and hemoglobin 11.1.  Respiratory panel negative for influenza, COVID-19, and RSV.  Patient has experienced elevated potassium throughout this admission.  Creatinine has risen to 1.94 today.  Chest x-ray suggestive of congestive heart failure with bibasilar edema and effusions.   Medications: Outpatient medications: Medications Prior to Admission  Medication Sig Dispense Refill Last Dose/Taking   albuterol  (PROVENTIL  HFA;VENTOLIN  HFA) 108 (90 BASE) MCG/ACT inhaler Inhale 1 puff into the lungs every 4 (four) hours as  needed for wheezing or shortness of breath.   Taking As Needed   albuterol  (PROVENTIL ) (2.5 MG/3ML) 0.083% nebulizer solution Take 2.5 mg by nebulization every 4 (four) hours as needed for wheezing or shortness of breath.   Taking As Needed   amLODipine  (NORVASC ) 10 MG tablet Take 10 mg by mouth daily.   04/15/2024   aspirin  EC 81 MG tablet Take 81 mg by mouth daily.   04/15/2024   budesonide -formoterol (SYMBICORT) 160-4.5 MCG/ACT inhaler Inhale 2 puffs into the lungs 2 (two) times daily.   04/15/2024   carbamazepine  (TEGRETOL  XR) 400 MG 12 hr tablet Take 400 mg by mouth daily.    04/15/2024   Cholecalciferol  (VITAMIN D) 2000 units tablet Take 2,000 Units by mouth daily.   04/15/2024   diazepam  (VALIUM ) 5 MG tablet Take 2.5 mg by mouth every 12 (twelve) hours as needed for anxiety.    Taking As Needed   doxycycline  (VIBRAMYCIN ) 100 MG capsule Take 100 mg by mouth 2 (two) times daily.   04/15/2024 Evening   DULoxetine  (CYMBALTA ) 60 MG capsule Take 60 mg by mouth daily.   04/15/2024   glipiZIDE  (GLUCOTROL ) 5 MG tablet Take 10 mg by mouth daily before breakfast.   04/15/2024   HYDROcodone -acetaminophen  (NORCO/VICODIN) 5-325 MG per tablet Take 1 tablet by mouth every 8 (eight) hours as needed for moderate pain.   Taking As Needed   JARDIANCE  25 MG TABS  tablet Take 25 mg by mouth daily.   Taking   levothyroxine  (SYNTHROID , LEVOTHROID) 112 MCG tablet Take 112 mcg by mouth daily.   04/15/2024   losartan  (COZAAR ) 50 MG tablet Take 50 mg by mouth daily.   04/15/2024   metFORMIN (GLUCOPHAGE) 1000 MG tablet Take 1,000 mg by mouth 2 (two) times daily with a meal.   04/15/2024 Evening   methocarbamol  (ROBAXIN ) 500 MG tablet Take 500 mg by mouth 4 (four) times daily as needed for muscle spasms.    Taking As Needed   omeprazole (PRILOSEC) 20 MG capsule Take 20 mg by mouth daily.   04/15/2024   pravastatin (PRAVACHOL) 80 MG tablet Take 80 mg by mouth at bedtime.   04/15/2024 Evening   predniSONE  (DELTASONE ) 10 MG tablet Take  10 mg by mouth See admin instructions. 3 tabs 2x daily days 1, 2, 3; 2 tabs 2x daily days 4, 5, 6; 1 tab 2x daily days 7, 8, 9; half tab 2x daily days 10, 11 and 12   Taking   rosuvastatin  (CRESTOR ) 40 MG tablet Take 40 mg by mouth daily.   04/15/2024   saccharomyces boulardii (FLORASTOR) 250 MG capsule Take 250 mg by mouth 2 (two) times daily.   04/15/2024 Evening    Current medications: Current Facility-Administered Medications  Medication Dose Route Frequency Provider Last Rate Last Admin   acetaminophen  (TYLENOL ) tablet 650 mg  650 mg Oral Q6H PRN Mansy, Jan A, MD       Or   acetaminophen  (TYLENOL ) suppository 650 mg  650 mg Rectal Q6H PRN Mansy, Jan A, MD       amiodarone  (NEXTERONE  PREMIX) 360-4.14 MG/200ML-% (1.8 mg/mL) IV infusion  30 mg/hr Intravenous Continuous Callwood, Dwayne D, MD 16.67 mL/hr at 04/17/24 1043 30 mg/hr at 04/17/24 1043   apixaban  (ELIQUIS ) tablet 5 mg  5 mg Oral BID Hudson, Caralyn, PA-C   5 mg at 04/17/24 1017   budesonide  (PULMICORT ) nebulizer solution 0.25 mg  0.25 mg Nebulization BID Ponnala, Shruthi, MD   0.25 mg at 04/17/24 9078   carbamazepine  (TEGRETOL  XR) 12 hr tablet 200 mg  200 mg Oral Daily Ponnala, Shruthi, MD   200 mg at 04/17/24 1017   cefTRIAXone  (ROCEPHIN ) 2 g in sodium chloride  0.9 % 100 mL IVPB  2 g Intravenous Q24H Mansy, Jan A, MD 200 mL/hr at 04/17/24 1232 2 g at 04/17/24 1232   chlorpheniramine-HYDROcodone  (TUSSIONEX) 10-8 MG/5ML suspension 5 mL  5 mL Oral Q12H PRN Mansy, Jan A, MD       cholecalciferol  (VITAMIN D3) tablet 2,000 Units  2,000 Units Oral Daily Mansy, Jan A, MD   2,000 Units at 04/17/24 9081   diazepam  (VALIUM ) tablet 2 mg  2 mg Oral Once Ponnala, Shruthi, MD       diazepam  (VALIUM ) tablet 2.5 mg  2.5 mg Oral Q12H PRN Mansy, Jan A, MD   2.5 mg at 04/16/24 2316   doxycycline  (VIBRA -TABS) tablet 100 mg  100 mg Oral BID Mansy, Jan A, MD   100 mg at 04/17/24 9078   DULoxetine  (CYMBALTA ) DR capsule 60 mg  60 mg Oral Daily Mansy, Jan A,  MD   60 mg at 04/17/24 9081   empagliflozin  (JARDIANCE ) tablet 25 mg  25 mg Oral Daily Mansy, Jan A, MD   25 mg at 04/17/24 1016   guaiFENesin  (MUCINEX ) 12 hr tablet 600 mg  600 mg Oral BID Mansy, Jan A, MD   600 mg at 04/17/24 917 034 [id number]  HYDROcodone -acetaminophen  (NORCO/VICODIN) 5-325 MG per tablet 1 tablet  1 tablet Oral Q8H PRN Mansy, Jan A, MD   1 tablet at 04/16/24 1839   insulin  aspart (novoLOG ) injection 0-15 Units  0-15 Units Subcutaneous TID WC Mansy, Madison LABOR, MD   8 Units at 04/17/24 9078   insulin  aspart (novoLOG ) injection 0-5 Units  0-5 Units Subcutaneous QHS Mansy, Jan A, MD       ipratropium (ATROVENT ) nebulizer solution 0.5 mg  0.5 mg Nebulization Q6H Ponnala, Shruthi, MD   0.5 mg at 04/17/24 0819   ipratropium (ATROVENT ) nebulizer solution 0.5 mg  0.5 mg Nebulization Q6H PRN Ponnala, Shruthi, MD   0.5 mg at 04/16/24 1545   levalbuterol  (XOPENEX ) nebulizer solution 1.25 mg  1.25 mg Nebulization Q6H Ponnala, Shruthi, MD   1.25 mg at 04/17/24 9082   levalbuterol  (XOPENEX ) nebulizer solution 1.25 mg  1.25 mg Nebulization Q6H PRN Ponnala, Shruthi, MD       levothyroxine  (SYNTHROID ) tablet 112 mcg  112 mcg Oral Q0600 Mansy, Jan A, MD   112 mcg at 04/17/24 9389   magnesium  hydroxide (MILK OF MAGNESIA) suspension 30 mL  30 mL Oral Daily PRN Mansy, Jan A, MD       methocarbamol  (ROBAXIN ) tablet 500 mg  500 mg Oral QID PRN Mansy, Jan A, MD       methylPREDNISolone  sodium succinate (SOLU-MEDROL ) 40 mg/mL injection 40 mg  40 mg Intravenous Q12H Mansy, Jan A, MD   40 mg at 04/17/24 9495   metoprolol  succinate (TOPROL -XL) 24 hr tablet 25 mg  25 mg Oral TID Callwood, Dwayne D, MD   25 mg at 04/17/24 9081   ondansetron  (ZOFRAN ) tablet 4 mg  4 mg Oral Q6H PRN Mansy, Jan A, MD       Or   ondansetron  (ZOFRAN ) injection 4 mg  4 mg Intravenous Q6H PRN Mansy, Jan A, MD       Oral care mouth rinse  15 mL Mouth Rinse PRN Ponnala, Shruthi, MD       pantoprazole  (PROTONIX ) EC tablet 40 mg  40 mg Oral Daily  Mansy, Jan A, MD   40 mg at 04/17/24 0920   rosuvastatin  (CRESTOR ) tablet 40 mg  40 mg Oral Daily Mansy, Jan A, MD   40 mg at 04/17/24 9082   saccharomyces boulardii (FLORASTOR) capsule 250 mg  250 mg Oral BID Mansy, Jan A, MD   250 mg at 04/17/24 1016   traZODone  (DESYREL ) tablet 25 mg  25 mg Oral QHS PRN Mansy, Jan A, MD   25 mg at 04/17/24 0018      Allergies: Allergies[1]    Past Medical History: Past Medical History:  Diagnosis Date   Arthritis    Depression    Diabetes mellitus without complication (HCC)    Fibromyalgia    Hypertension    Thyroid  disease      Past Surgical History: Past Surgical History:  Procedure Laterality Date   ABDOMINAL HYSTERECTOMY     APPENDECTOMY     HAND SURGERY Right    REPLACEMENT TOTAL KNEE BILATERAL     THYROID  SURGERY       Family History: Family History  Problem Relation Age of Onset   Breast cancer Mother 80     Social History: Social History   Socioeconomic History   Marital status: Single    Spouse name: Not on file   Number of children: Not on file   Years of education: Not on file   Highest  education level: Not on file  Occupational History   Not on file  Tobacco Use   Smoking status: Former   Smokeless tobacco: Not on file  Substance and Sexual Activity   Alcohol use: No   Drug use: No   Sexual activity: Not on file  Other Topics Concern   Not on file  Social History Narrative   Not on file   Social Drivers of Health   Tobacco Use: Medium Risk (04/15/2024)   Patient History    Smoking Tobacco Use: Former    Smokeless Tobacco Use: Unknown    Passive Exposure: Not on file  Financial Resource Strain: Low Risk  (12/01/2023)   Received from [facility]   Overall Financial Resource Strain (CARDIA)    Difficulty of Paying Living Expenses: Not hard at all  Food Insecurity: No Food Insecurity (04/16/2024)   Epic    Worried About Programme Researcher, Broadcasting/film/video in the Last Year: Never true    Ran Out  of Food in the Last Year: Never true  Transportation Needs: No Transportation Needs (04/16/2024)   Epic    Lack of Transportation (Medical): No    Lack of Transportation (Non-Medical): No  Physical Activity: Not on file  Stress: Not on file  Social Connections: Patient Declined (04/16/2024)   Social Connection and Isolation Panel    Frequency of Communication with Friends and Family: Patient declined    Frequency of Social Gatherings with Friends and Family: Patient declined    Attends Religious Services: Patient declined    Database Administrator or Organizations: Patient declined    Attends Banker Meetings: Patient declined    Marital Status: Patient declined  Intimate Partner Violence: Not At Risk (04/16/2024)   Epic    Fear of Current or Ex-Partner: No    Emotionally Abused: No    Physically Abused: No    Sexually Abused: No  Depression (PHQ2-9): Not on file  Alcohol Screen: Not on file  Housing: Low Risk (04/16/2024)   Epic    Unable to Pay for Housing in the Last Year: No    Number of Times Moved in the Last Year: 0    Homeless in the Last Year: No  Utilities: Not At Risk (04/16/2024)   Epic    Threatened with loss of utilities: No  Health Literacy: Not on file     Review of Systems: Review of Systems  Constitutional:  Negative for chills, fever and malaise/fatigue.  HENT:  Negative for congestion, sore throat and tinnitus.   Eyes:  Negative for blurred vision and redness.  Respiratory:  Positive for shortness of breath. Negative for cough and wheezing.   Cardiovascular:  Negative for chest pain, palpitations, claudication and leg swelling.  Gastrointestinal:  Negative for abdominal pain, blood in stool, diarrhea, nausea and vomiting.  Genitourinary:  Negative for flank pain, frequency and hematuria.  Musculoskeletal:  Negative for back pain, falls and myalgias.  Skin:  Negative for rash.  Neurological:  Negative for dizziness, weakness and headaches.   Endo/Heme/Allergies:  Does not bruise/bleed easily.  Psychiatric/Behavioral:  Negative for depression. The patient is not nervous/anxious and does not have insomnia.     Vital Signs: Blood pressure (!) 123/47, pulse 96, temperature 97.6 F (36.4 C), temperature source Oral, resp. rate (!) 23, height 5' 2 (1.575 m), weight 85.3 kg, SpO2 98%.  Weight trends: Filed Weights   04/15/24 2214 04/17/24 0500  Weight: 82.6 kg 85.3 kg  Physical Exam: General: NAD  Head: Normocephalic, atraumatic. Moist oral mucosal membranes  Eyes: Anicteric  Lungs:  Clear to auscultation, normal effort, 4L   Heart: Regular rate and rhythm  Abdomen:  Soft, nontender,   Extremities:  NO peripheral edema.  Neurologic: Awake and alert  Skin: No lesions        Lab results: Basic Metabolic Panel: Recent Labs  Lab 04/15/24 2220 04/16/24 0450 04/16/24 1013 04/16/24 1802 04/17/24 0018 04/17/24 0451  NA 132* 131*  --   --   --  132*  K 5.3* 5.7*   < > 5.6* 6.4* 5.4*  CL 97* 98  --   --   --  94*  CO2 19* 20*  --   --   --  21*  GLUCOSE 305* 364*  --   --   --  194*  BUN 47* 48*  --   --   --  67*  CREATININE 1.30* 1.21*  --   --   --  1.94*  CALCIUM  9.4 8.8*  --   --   --  8.9  MG 2.4  --   --   --   --  2.5*   < > = values in this interval not displayed.    Liver Function Tests: Recent Labs  Lab 04/15/24 2220  AST 20  ALT 23  ALKPHOS 72  BILITOT 0.4  PROT 8.0  ALBUMIN 4.4   No results for input(s): LIPASE, AMYLASE in the last 168 hours. No results for input(s): AMMONIA in the last 168 hours.  CBC: Recent Labs  Lab 04/15/24 2220 04/16/24 0450 04/17/24 0451  WBC 14.6* 14.0* 17.8*  NEUTROABS 9.9*  --   --   HGB 11.1* 10.1* 10.5*  HCT 35.3* 32.2* 33.2*  MCV 92.2 92.0 90.0  PLT 350 321 321    Cardiac Enzymes: No results for input(s): CKTOTAL, CKMB, CKMBINDEX, TROPONINI in the last 168 hours.  BNP: Invalid input(s): POCBNP  CBG: Recent Labs  Lab  04/16/24 1212 04/16/24 1740 04/16/24 2107 04/17/24 0903 04/17/24 1322  GLUCAP 87 223* 145* 285* 236*    Microbiology: Results for orders placed or performed during the hospital encounter of 04/15/24  Resp panel by RT-PCR (RSV, Flu A&B, Covid) Anterior Nasal Swab     Status: None   Collection Time: 04/15/24 10:20 PM   Specimen: Anterior Nasal Swab  Result Value Ref Range Status   SARS Coronavirus 2 by RT PCR NEGATIVE NEGATIVE Final    Comment: (NOTE) SARS-CoV-2 target nucleic acids are NOT DETECTED.  The SARS-CoV-2 RNA is generally detectable in upper respiratory specimens during the acute phase of infection. The lowest concentration of SARS-CoV-2 viral copies this assay can detect is 138 copies/mL. A negative result does not preclude SARS-Cov-2 infection and should not be used as the sole basis for treatment or other patient management decisions. A negative result may occur with  improper specimen collection/handling, submission of specimen other than nasopharyngeal swab, presence of viral mutation(s) within the areas targeted by this assay, and inadequate number of viral copies(<138 copies/mL). A negative result must be combined with clinical observations, patient history, and epidemiological information. The expected result is Negative.  Fact Sheet for Patients:  bloggercourse.com  Fact Sheet for Healthcare Providers:  seriousbroker.it  This test is no t yet approved or cleared by the [place]  FDA and  has been authorized for detection and/or diagnosis of SARS-CoV-2 by FDA under an Emergency Use Authorization (EUA). This EUA will remain  in effect (meaning this test can be used) for the duration of the COVID-19 declaration under Section 564(b)(1) of the Act, 21 U.S.C.section 360bbb-3(b)(1), unless the authorization is terminated  or revoked sooner.       Influenza A by PCR NEGATIVE NEGATIVE Final   Influenza B by  PCR NEGATIVE NEGATIVE Final    Comment: (NOTE) The Xpert Xpress SARS-CoV-2/FLU/RSV plus assay is intended as an aid in the diagnosis of influenza from Nasopharyngeal swab specimens and should not be used as a sole basis for treatment. Nasal washings and aspirates are unacceptable for Xpert Xpress SARS-CoV-2/FLU/RSV testing.  Fact Sheet for Patients: bloggercourse.com  Fact Sheet for Healthcare Providers: seriousbroker.it  This test is not yet approved or cleared by the [place]  FDA and has been authorized for detection and/or diagnosis of SARS-CoV-2 by FDA under an Emergency Use Authorization (EUA). This EUA will remain in effect (meaning this test can be used) for the duration of the COVID-19 declaration under Section 564(b)(1) of the Act, 21 U.S.C. section 360bbb-3(b)(1), unless the authorization is terminated or revoked.     Resp Syncytial Virus by PCR NEGATIVE NEGATIVE Final    Comment: (NOTE) Fact Sheet for Patients: bloggercourse.com  Fact Sheet for Healthcare Providers: seriousbroker.it  This test is not yet approved or cleared by the [place]  FDA and has been authorized for detection and/or diagnosis of SARS-CoV-2 by FDA under an Emergency Use Authorization (EUA). This EUA will remain in effect (meaning this test can be used) for the duration of the COVID-19 declaration under Section 564(b)(1) of the Act, 21 U.S.C. section 360bbb-3(b)(1), unless the authorization is terminated or revoked.  Performed at [facility], [address] [place], [place] [address]   Blood culture (routine x 2)     Status: None (Preliminary result)   Collection Time: 04/15/24 10:20 PM   Specimen: BLOOD  Result Value Ref Range Status   Specimen Description BLOOD BLOOD LEFT ARM  Final   Special Requests   Final    BOTTLES DRAWN AEROBIC AND ANAEROBIC Blood Culture  adequate volume   Culture   Final    NO GROWTH 2 DAYS Performed at [facility], [address] [place], [place] [address]    Report Status PENDING  Incomplete  Blood culture (routine x 2)     Status: None (Preliminary result)   Collection Time: 04/15/24 10:20 PM   Specimen: BLOOD  Result Value Ref Range Status   Specimen Description BLOOD BLOOD RIGHT FOREARM  Final   Special Requests   Final    BOTTLES DRAWN AEROBIC AND ANAEROBIC Blood Culture adequate volume   Culture   Final    NO GROWTH 2 DAYS Performed at [facility], [address] [place], [place] [address]    Report Status PENDING  Incomplete  Respiratory (~20 pathogens) panel by PCR     Status: None   Collection Time: 04/16/24  3:01 PM   Specimen: Nasopharyngeal Swab; Respiratory  Result Value Ref Range Status   Adenovirus NOT DETECTED NOT DETECTED Final   Coronavirus 229E NOT DETECTED NOT DETECTED Final    Comment: (NOTE) The Coronavirus on the Respiratory Panel, DOES NOT test for the novel  Coronavirus (2019 nCoV)    Coronavirus HKU1 NOT DETECTED NOT DETECTED Final   Coronavirus NL63 NOT DETECTED NOT DETECTED Final   Coronavirus OC43 NOT DETECTED NOT DETECTED Final   Metapneumovirus NOT DETECTED NOT DETECTED Final   Rhinovirus / Enterovirus NOT DETECTED NOT DETECTED Final  Influenza A NOT DETECTED NOT DETECTED Final   Influenza B NOT DETECTED NOT DETECTED Final   Parainfluenza Virus 1 NOT DETECTED NOT DETECTED Final   Parainfluenza Virus 2 NOT DETECTED NOT DETECTED Final   Parainfluenza Virus 3 NOT DETECTED NOT DETECTED Final   Parainfluenza Virus 4 NOT DETECTED NOT DETECTED Final   Respiratory Syncytial Virus NOT DETECTED NOT DETECTED Final   Bordetella pertussis NOT DETECTED NOT DETECTED Final   Bordetella Parapertussis NOT DETECTED NOT DETECTED Final   Chlamydophila pneumoniae NOT DETECTED NOT DETECTED Final   Mycoplasma pneumoniae NOT DETECTED NOT DETECTED Final    Comment: Performed  at [facility], [address] [place], [place] [address]    Coagulation Studies: Recent Labs    04/16/24 0023 04/16/24 0450 04/16/24 1013  LABPROT 15.6* 15.9* 15.5*  INR 1.2 1.2 1.2    Urinalysis: No results for input(s): COLORURINE, LABSPEC, PHURINE, GLUCOSEU, HGBUR, BILIRUBINUR, KETONESUR, PROTEINUR, UROBILINOGEN, NITRITE, LEUKOCYTESUR in the last 72 hours.  Invalid input(s): APPERANCEUR    Imaging: ECHOCARDIOGRAM COMPLETE Result Date: 04/16/2024    ECHOCARDIOGRAM REPORT   Patient Name:   Ivannia Dakin Date of Exam: 04/16/2024 Medical Rec #:  [id number]       Height:       62.0 in Accession #:    [id number]      Weight:       182.0 lb Date of Birth:  [birth year]       BSA:          1.837 m Patient Age:    68 years        BP:           156/70 mmHg Patient Gender: F               HR:           116 bpm. Exam Location:  Inpatient Procedure: 2D Echo, Color Doppler, Cardiac Doppler and Intracardiac            Opacification Agent (Both Spectral and Color Flow Doppler were            utilized during procedure). Indications:     CHF-Acute Diastolic  History:         Patient has no prior history of Echocardiogram examinations.                  Risk Factors:Hypertension, Diabetes, Former Smoker and Thyroid                   Disease.  Sonographer:     Logan Shove RDCS Referring Phys:  [id number] JAN A MANSY Diagnosing Phys: Annabella Scarce MD  Sonographer Comments: Technically difficult study due to poor echo windows. IMPRESSIONS  1. Left ventricular ejection fraction, by estimation, is 30 to 35%. The left ventricle has moderately decreased function. The left ventricle demonstrates global hypokinesis. Left ventricular diastolic parameters are indeterminate.  2. Right ventricular systolic function is mildly reduced. The right ventricular size is normal. There is moderately elevated pulmonary artery systolic pressure.  3. Left atrial size was mildly dilated.  4. There is a round  echodensity on the mitral valve chord measuring 0.8 x 0.5 cm. This likely represents calcification. Cannot rule out endocarditis. Consider TEE or cardiac MRI to better evaluate. The mitral valve is degenerative. Moderate mitral valve  regurgitation. No evidence of mitral stenosis. The mean mitral valve gradient is 3.3 mmHg with average heart rate of 119 bpm.  5. Tricuspid valve regurgitation is mild  to moderate.  6. The aortic valve is normal in structure. Aortic valve regurgitation is moderate to severe. No aortic stenosis is present. Aortic regurgitation PHT measures 237 msec.  7. The inferior vena cava is dilated in size with <50% respiratory variability, suggesting right atrial pressure of 15 mmHg. FINDINGS  Left Ventricle: Left ventricular ejection fraction, by estimation, is 30 to 35%. The left ventricle has moderately decreased function. The left ventricle demonstrates global hypokinesis. Definity  contrast agent was given IV to delineate the left ventricular endocardial borders. The left ventricular internal cavity size was normal in size. There is no left ventricular hypertrophy. Left ventricular diastolic parameters are indeterminate. Right Ventricle: The right ventricular size is normal. No increase in right ventricular wall thickness. Right ventricular systolic function is mildly reduced. There is moderately elevated pulmonary artery systolic pressure. The tricuspid regurgitant velocity is 3.34 m/s, and with an assumed right atrial pressure of 15 mmHg, the estimated right ventricular systolic pressure is 59.6 mmHg. Left Atrium: Left atrial size was mildly dilated. Right Atrium: Right atrial size was normal in size. Pericardium: There is no evidence of pericardial effusion. Mitral Valve: There is a round echodensity on the mitral valve chord measuring 0.8 x 0.5 cm. This likely represents calcification. Cannot rule out endocarditis. Consider TEE or cardiac MRI to better evaluate. The mitral valve is  degenerative in appearance. There is moderate thickening of the mitral valve leaflet(s). There is moderate calcification of the mitral valve leaflet(s). Moderate mitral valve regurgitation. No evidence of mitral valve stenosis. The mean mitral valve gradient is 3.3 mmHg  with average heart rate of 119 bpm. Tricuspid Valve: The tricuspid valve is normal in structure. Tricuspid valve regurgitation is mild to moderate. No evidence of tricuspid stenosis. Aortic Valve: The aortic valve is normal in structure. Aortic valve regurgitation is moderate to severe. Aortic regurgitation PHT measures 237 msec. No aortic stenosis is present. Aortic valve peak gradient measures 13.5 mmHg. Pulmonic Valve: The pulmonic valve was normal in structure. Pulmonic valve regurgitation is not visualized. No evidence of pulmonic stenosis. Aorta: The aortic root is normal in size and structure. Venous: The inferior vena cava is dilated in size with less than 50% respiratory variability, suggesting right atrial pressure of 15 mmHg. IAS/Shunts: No atrial level shunt detected by color flow Doppler.  LEFT VENTRICLE PLAX 2D LVIDd:         4.10 cm LVIDs:         3.30 cm LV PW:         0.90 cm LV IVS:        0.80 cm LVOT diam:     1.90 cm LVOT Area:     2.84 cm  LV Volumes (MOD) LV vol d, MOD A2C: 143.0 ml LV vol d, MOD A4C: 106.0 ml LV vol s, MOD A2C: 92.9 ml LV vol s, MOD A4C: 67.7 ml LV SV MOD A2C:     50.1 ml LV SV MOD A4C:     106.0 ml LV SV MOD BP:      42.2 ml RIGHT VENTRICLE            IVC RV Basal diam:  3.50 cm    IVC diam: 2.00 cm RV S prime:     7.34 cm/s TAPSE (M-mode): 1.8 cm LEFT ATRIUM             Index        RIGHT ATRIUM           Index LA  diam:        3.80 cm 2.07 cm/m   RA Area:     17.40 cm LA Vol (A2C):   88.7 ml 48.30 ml/m  RA Volume:   45.00 ml  24.50 ml/m LA Vol (A4C):   31.7 ml 17.26 ml/m LA Biplane Vol: 54.4 ml 29.62 ml/m  AORTIC VALVE AV Area (Vmax): 1.53 cm AV Vmax:        183.60 cm/s AV Peak Grad:   13.5 mmHg  LVOT Vmax:      98.98 cm/s AI PHT:         237 msec  AORTA Ao Root diam: 2.30 cm Ao Asc diam:  3.20 cm MITRAL VALVE                  TRICUSPID VALVE MV Mean grad: 3.3 mmHg        TR Peak grad:   44.6 mmHg MR Peak grad:    90.6 mmHg    TR Mean grad:   26.0 mmHg MR Mean grad:    56.0 mmHg    TR Vmax:        334.00 cm/s MR Vmax:         476.00 cm/s  TR Vmean:       232.0 cm/s MR Vmean:        350.0 cm/s MR PISA:         5.09 cm     SHUNTS MR PISA Eff ROA: 41 mm       Systemic Diam: 1.90 cm MR PISA Radius:  0.90 cm Annabella Scarce MD Electronically signed by Annabella Scarce MD Signature Date/Time: 04/16/2024/12:54:51 PM    Final    DG Chest Portable 1 View Result Date: 04/15/2024 CLINICAL DATA:  Short of breath EXAM: PORTABLE CHEST 1 VIEW COMPARISON:  02/11/2021 FINDINGS: Single frontal view of the chest demonstrates an enlarged cardiac silhouette. Bibasilar veiling opacities, right greater than left, consistent with consolidation and/or effusions. No pneumothorax. No acute bony abnormalities. IMPRESSION: 1. Constellation of findings suggesting congestive heart failure, with bibasilar edema and effusions. Electronically Signed   By: Ozell Daring M.D.   On: 04/15/2024 23:06     Assessment & Plan: Ms. Deolinda Frid is a 69 y.o.  female with past medical conditions including hypertension, diabetes type 2, fibromyalgia, COPD, aortic insufficiency, and hypothyroidism, chronic kidney disease stage II, who was admitted to [facility] on 04/15/2024 for Bronchospasm [J98.01] Left bundle branch block [I44.7] Atrial fibrillation with rapid ventricular response (HCC) [I48.91] Atrial fibrillation with RVR (HCC) [I48.91] TSH elevation [R79.89] Congestive heart failure, unspecified HF chronicity, unspecified heart failure type (HCC) [I50.9]  Acute kidney injury on chronic kidney disease stage II. Baseline creatinine 1.0 with GFR 61 in August. Acute kidney injury likely multifactorial, IV diuresis and dysrhythmia. Will  order renal US  for obstruction. Will hold IV diuresis for now. Creatinine 1.94 today. No acute indication for dialysis.   2. Hyperkalemia/hyponatremia. Sodium has fluctuated between 131-132 during admission. Potassium remains elevated, 5.4. Being treated with shifting measures.   3. Anemia of chronic kidney disease Lab Results  Component Value Date   HGB 10.5 (L) 04/17/2024    Hgb 10.5, acceptable at this time.  4. Acute systolic CHF, ECHO from 1/11 shows EF 30-35 % with global hypokinesis. Questionable endocarditis, recommend TEE for evaluation. Furosemide  held  5. Secondary Hyperparathyroidism: with outpatient labs: PTH 57, phosphorus 3.5, calcium  9.0 on 12/01/23.   Lab Results  Component Value Date   CALCIUM  8.9 04/17/2024  Will continue to monitor bone minerals during this admission.   LOS: 1 Spurgeon Gancarz 1/12/20261:35 PM     [1]  Allergies Allergen Reactions   Augmentin [Amoxicillin-Pot Clavulanate] Diarrhea    Has patient had a PCN reaction causing immediate rash, facial/tongue/throat swelling, SOB or lightheadedness with hypotension: No Has patient had a PCN reaction causing severe rash involving mucus membranes or skin necrosis: No Has patient had a PCN reaction that required hospitalization: No Has patient had a PCN reaction occurring within the last 10 years: No If all of the above answers are NO, then may proceed with Cephalosporin use.   Gabapentin Other (See Comments)    High doses led to falls   Lisinopril Other (See Comments)    Reaction: Blood pressure    "

## 2024-04-18 DIAGNOSIS — I4891 Unspecified atrial fibrillation: Secondary | ICD-10-CM | POA: Diagnosis not present

## 2024-04-18 LAB — DIGOXIN LEVEL: Digoxin Level: 1.6 ng/mL (ref 0.8–2.0)

## 2024-04-18 LAB — GLUCOSE, CAPILLARY
Glucose-Capillary: 105 mg/dL — ABNORMAL HIGH (ref 70–99)
Glucose-Capillary: 167 mg/dL — ABNORMAL HIGH (ref 70–99)
Glucose-Capillary: 212 mg/dL — ABNORMAL HIGH (ref 70–99)
Glucose-Capillary: 219 mg/dL — ABNORMAL HIGH (ref 70–99)

## 2024-04-18 LAB — CBC
HCT: 32.5 % — ABNORMAL LOW (ref 36.0–46.0)
Hemoglobin: 10.3 g/dL — ABNORMAL LOW (ref 12.0–15.0)
MCH: 28.8 pg (ref 26.0–34.0)
MCHC: 31.7 g/dL (ref 30.0–36.0)
MCV: 90.8 fL (ref 80.0–100.0)
Platelets: 377 K/uL (ref 150–400)
RBC: 3.58 MIL/uL — ABNORMAL LOW (ref 3.87–5.11)
RDW: 15.3 % (ref 11.5–15.5)
WBC: 18.6 K/uL — ABNORMAL HIGH (ref 4.0–10.5)
nRBC: 0.1 % (ref 0.0–0.2)

## 2024-04-18 LAB — BASIC METABOLIC PANEL WITH GFR
Anion gap: 16 — ABNORMAL HIGH (ref 5–15)
BUN: 82 mg/dL — ABNORMAL HIGH (ref 8–23)
CO2: 23 mmol/L (ref 22–32)
Calcium: 8.5 mg/dL — ABNORMAL LOW (ref 8.9–10.3)
Chloride: 94 mmol/L — ABNORMAL LOW (ref 98–111)
Creatinine, Ser: 2.19 mg/dL — ABNORMAL HIGH (ref 0.44–1.00)
GFR, Estimated: 24 mL/min — ABNORMAL LOW
Glucose, Bld: 104 mg/dL — ABNORMAL HIGH (ref 70–99)
Potassium: 5 mmol/L (ref 3.5–5.1)
Sodium: 133 mmol/L — ABNORMAL LOW (ref 135–145)

## 2024-04-18 MED ORDER — SODIUM ZIRCONIUM CYCLOSILICATE 10 G PO PACK
10.0000 g | PACK | Freq: Once | ORAL | Status: AC
  Administered 2024-04-18: 10 g via ORAL
  Filled 2024-04-18: qty 1

## 2024-04-18 MED ORDER — DIGOXIN 125 MCG PO TABS
0.0625 mg | ORAL_TABLET | Freq: Every day | ORAL | Status: DC
  Filled 2024-04-18: qty 0.5

## 2024-04-18 MED ORDER — METOPROLOL SUCCINATE ER 25 MG PO TB24
37.5000 mg | ORAL_TABLET | Freq: Three times a day (TID) | ORAL | Status: DC
  Administered 2024-04-18 (×3): 37.5 mg via ORAL
  Filled 2024-04-18 (×3): qty 2

## 2024-04-18 MED ORDER — METOPROLOL SUCCINATE ER 50 MG PO TB24: 50.0000 mg | ORAL_TABLET | Freq: Three times a day (TID) | ORAL | Status: DC

## 2024-04-18 NOTE — Progress Notes (Signed)
 " [facility]  ROUNDING NOTE   Subjective:   Patient seen resting in bed With family at bedside 2L [place]  Creatinine 2.19  Objective:  Vital signs in last 24 hours:  Temp:  [97.5 F (36.4 C)-98.3 F (36.8 C)] 98.2 F (36.8 C) (01/13 1211) Pulse Rate:  [97-104] 104 (01/13 0405) Resp:  [18-20] 18 (01/13 0815) BP: (99-131)/(42-82) 99/82 (01/13 0803) SpO2:  [92 %-99 %] 99 % (01/13 0405) FiO2 (%):  [28 %] 28 % (01/12 1617) Weight:  [85 kg] 85 kg (01/13 0500)  Weight change: -0.3 kg Filed Weights   04/15/24 2214 04/17/24 0500 04/18/24 0500  Weight: 82.6 kg 85.3 kg 85 kg    Intake/Output: I/O last 3 completed shifts: In: 592.7 [I.V.:592.7] Out: 600 [Urine:600]   Intake/Output this shift:  Total I/O In: 360 [P.O.:360] Out: -   Physical Exam: General: NAD  Head: Normocephalic, atraumatic. Moist oral mucosal membranes  Eyes: Anicteric  Lungs:  Clear to auscultation, normal effort  Heart: Irregular rate and rhythm  Abdomen:  Soft, nontender  Extremities:  No peripheral edema.  Neurologic: Awake, alert, conversant  Skin: Warm,dry, no rash       Basic Metabolic Panel: Recent Labs  Lab 04/15/24 2220 04/16/24 0450 04/16/24 1013 04/16/24 1802 04/17/24 0018 04/17/24 0451 04/17/24 1952 04/18/24 0358  NA 132* 131*  --   --   --  132*  --  133*  K 5.3* 5.7*   < > 5.6* 6.4* 5.4* 4.9 5.0  CL 97* 98  --   --   --  94*  --  94*  CO2 19* 20*  --   --   --  21*  --  23  GLUCOSE 305* 364*  --   --   --  194*  --  104*  BUN 47* 48*  --   --   --  67*  --  82*  CREATININE 1.30* 1.21*  --   --   --  1.94*  --  2.19*  CALCIUM  9.4 8.8*  --   --   --  8.9  --  8.5*  MG 2.4  --   --   --   --  2.5*  --   --    < > = values in this interval not displayed.    Liver Function Tests: Recent Labs  Lab 04/15/24 2220  AST 20  ALT 23  ALKPHOS 72  BILITOT 0.4  PROT 8.0  ALBUMIN 4.4   No results for input(s): LIPASE, AMYLASE in the last 168 hours. No results  for input(s): AMMONIA in the last 168 hours.  CBC: Recent Labs  Lab 04/15/24 2220 04/16/24 0450 04/17/24 0451 04/18/24 0358  WBC 14.6* 14.0* 17.8* 18.6*  NEUTROABS 9.9*  --   --   --   HGB 11.1* 10.1* 10.5* 10.3*  HCT 35.3* 32.2* 33.2* 32.5*  MCV 92.2 92.0 90.0 90.8  PLT 350 321 321 377    Cardiac Enzymes: No results for input(s): CKTOTAL, CKMB, CKMBINDEX, TROPONINI in the last 168 hours.  BNP: Invalid input(s): POCBNP  CBG: Recent Labs  Lab 04/17/24 1322 04/17/24 1753 04/17/24 2027 04/18/24 0801 04/18/24 1206  GLUCAP 236* 142* 91 105* 167*    Microbiology: Results for orders placed or performed during the hospital encounter of 04/15/24  Resp panel by RT-PCR (RSV, Flu A&B, Covid) Anterior Nasal Swab     Status: None   Collection Time: 04/15/24 10:20 PM   Specimen: Anterior  Nasal Swab  Result Value Ref Range Status   SARS Coronavirus 2 by RT PCR NEGATIVE NEGATIVE Final    Comment: (NOTE) SARS-CoV-2 target nucleic acids are NOT DETECTED.  The SARS-CoV-2 RNA is generally detectable in upper respiratory specimens during the acute phase of infection. The lowest concentration of SARS-CoV-2 viral copies this assay can detect is 138 copies/mL. A negative result does not preclude SARS-Cov-2 infection and should not be used as the sole basis for treatment or other patient management decisions. A negative result may occur with  improper specimen collection/handling, submission of specimen other than nasopharyngeal swab, presence of viral mutation(s) within the areas targeted by this assay, and inadequate number of viral copies(<138 copies/mL). A negative result must be combined with clinical observations, patient history, and epidemiological information. The expected result is Negative.  Fact Sheet for Patients:  bloggercourse.com  Fact Sheet for Healthcare Providers:  seriousbroker.it  This test is no t  yet approved or cleared by the [place]  FDA and  has been authorized for detection and/or diagnosis of SARS-CoV-2 by FDA under an Emergency Use Authorization (EUA). This EUA will remain  in effect (meaning this test can be used) for the duration of the COVID-19 declaration under Section 564(b)(1) of the Act, 21 U.S.C.section 360bbb-3(b)(1), unless the authorization is terminated  or revoked sooner.       Influenza A by PCR NEGATIVE NEGATIVE Final   Influenza B by PCR NEGATIVE NEGATIVE Final    Comment: (NOTE) The Xpert Xpress SARS-CoV-2/FLU/RSV plus assay is intended as an aid in the diagnosis of influenza from Nasopharyngeal swab specimens and should not be used as a sole basis for treatment. Nasal washings and aspirates are unacceptable for Xpert Xpress SARS-CoV-2/FLU/RSV testing.  Fact Sheet for Patients: bloggercourse.com  Fact Sheet for Healthcare Providers: seriousbroker.it  This test is not yet approved or cleared by the [place]  FDA and has been authorized for detection and/or diagnosis of SARS-CoV-2 by FDA under an Emergency Use Authorization (EUA). This EUA will remain in effect (meaning this test can be used) for the duration of the COVID-19 declaration under Section 564(b)(1) of the Act, 21 U.S.C. section 360bbb-3(b)(1), unless the authorization is terminated or revoked.     Resp Syncytial Virus by PCR NEGATIVE NEGATIVE Final    Comment: (NOTE) Fact Sheet for Patients: bloggercourse.com  Fact Sheet for Healthcare Providers: seriousbroker.it  This test is not yet approved or cleared by the [place]  FDA and has been authorized for detection and/or diagnosis of SARS-CoV-2 by FDA under an Emergency Use Authorization (EUA). This EUA will remain in effect (meaning this test can be used) for the duration of the COVID-19 declaration under Section 564(b)(1)  of the Act, 21 U.S.C. section 360bbb-3(b)(1), unless the authorization is terminated or revoked.  Performed at [facility], [address] [place], [place] [address]   Blood culture (routine x 2)     Status: None (Preliminary result)   Collection Time: 04/15/24 10:20 PM   Specimen: BLOOD  Result Value Ref Range Status   Specimen Description BLOOD BLOOD LEFT ARM  Final   Special Requests   Final    BOTTLES DRAWN AEROBIC AND ANAEROBIC Blood Culture adequate volume   Culture   Final    NO GROWTH 3 DAYS Performed at [facility], [address] [place], [place] [address]    Report Status PENDING  Incomplete  Blood culture (routine x 2)     Status: None (Preliminary result)   Collection  Time: 04/15/24 10:20 PM   Specimen: BLOOD  Result Value Ref Range Status   Specimen Description BLOOD BLOOD RIGHT FOREARM  Final   Special Requests   Final    BOTTLES DRAWN AEROBIC AND ANAEROBIC Blood Culture adequate volume   Culture   Final    NO GROWTH 3 DAYS Performed at [facility], [address] [place], [place] [address]    Report Status PENDING  Incomplete  Respiratory (~20 pathogens) panel by PCR     Status: None   Collection Time: 04/16/24  3:01 PM   Specimen: Nasopharyngeal Swab; Respiratory  Result Value Ref Range Status   Adenovirus NOT DETECTED NOT DETECTED Final   Coronavirus 229E NOT DETECTED NOT DETECTED Final    Comment: (NOTE) The Coronavirus on the Respiratory Panel, DOES NOT test for the novel  Coronavirus (2019 nCoV)    Coronavirus HKU1 NOT DETECTED NOT DETECTED Final   Coronavirus NL63 NOT DETECTED NOT DETECTED Final   Coronavirus OC43 NOT DETECTED NOT DETECTED Final   Metapneumovirus NOT DETECTED NOT DETECTED Final   Rhinovirus / Enterovirus NOT DETECTED NOT DETECTED Final   Influenza A NOT DETECTED NOT DETECTED Final   Influenza B NOT DETECTED NOT DETECTED Final   Parainfluenza Virus 1 NOT DETECTED NOT DETECTED Final    Parainfluenza Virus 2 NOT DETECTED NOT DETECTED Final   Parainfluenza Virus 3 NOT DETECTED NOT DETECTED Final   Parainfluenza Virus 4 NOT DETECTED NOT DETECTED Final   Respiratory Syncytial Virus NOT DETECTED NOT DETECTED Final   Bordetella pertussis NOT DETECTED NOT DETECTED Final   Bordetella Parapertussis NOT DETECTED NOT DETECTED Final   Chlamydophila pneumoniae NOT DETECTED NOT DETECTED Final   Mycoplasma pneumoniae NOT DETECTED NOT DETECTED Final    Comment: Performed at [facility], [address], [place], [place] [address]    Coagulation Studies: Recent Labs    04/16/24 0023 04/16/24 0450 04/16/24 1013  LABPROT 15.6* 15.9* 15.5*  INR 1.2 1.2 1.2    Urinalysis: No results for input(s): COLORURINE, LABSPEC, PHURINE, GLUCOSEU, HGBUR, BILIRUBINUR, KETONESUR, PROTEINUR, UROBILINOGEN, NITRITE, LEUKOCYTESUR in the last 72 hours.  Invalid input(s): APPERANCEUR    Imaging: US  RENAL Result Date: 04/17/2024 EXAM: RETROPERITONEAL ULTRASOUND OF THE KIDNEYS 04/17/2024 12:02:58 PM TECHNIQUE: Real-time ultrasonography of the retroperitoneum, specifically the kidneys and urinary bladder, was performed. COMPARISON: None available. CLINICAL HISTORY: Acute kidney failure. FINDINGS: RIGHT KIDNEY: Right kidney measures 10.4 x 4.4 x 4.7 cm. Mild cortical thinning is noted. Calculated volume is 113 ml. No hydronephrosis. No mass. LEFT KIDNEY: Left kidney measures 9.7 x 5.8 x 4.4 cm. Calculated volume is 128 ml. Mild cortical thinning is noted. No hydronephrosis. No mass. BLADDER: Decompressed INCIDENTAL FINDINGS: Small bilateral pleural effusions. IMPRESSION: 1. Mild cortical thinning in both kidneys. 2. Small bilateral pleural effusions. Electronically signed by: Oneil Devonshire MD MD 04/17/2024 11:43 PM EST RP Workstation: [id number]     Medications:    amiodarone  30 mg/hr (04/17/24 2315)   cefTRIAXone  (ROCEPHIN )  IV 2 g (04/18/24 1205)    apixaban   5 mg Oral BID    budesonide  (PULMICORT ) nebulizer solution  0.25 mg Nebulization BID   carbamazepine   200 mg Oral Daily   cholecalciferol   2,000 Units Oral Daily   [START ON 04/19/2024] digoxin   0.0625 mg Oral Daily   doxycycline   100 mg Oral BID   DULoxetine   60 mg Oral Daily   empagliflozin   25 mg Oral Daily   guaiFENesin   600 mg Oral BID  insulin  aspart  0-15 Units Subcutaneous TID WC   insulin  aspart  0-5 Units Subcutaneous QHS   levothyroxine   112 mcg Oral Q0600   metoprolol  succinate  37.5 mg Oral TID   pantoprazole   40 mg Oral Daily   predniSONE   40 mg Oral Q breakfast   rosuvastatin   40 mg Oral Daily   saccharomyces boulardii  250 mg Oral BID   acetaminophen  **OR** acetaminophen , chlorpheniramine-HYDROcodone , diazepam , HYDROcodone -acetaminophen , ipratropium, levalbuterol , magnesium  hydroxide, menthol , methocarbamol , ondansetron  **OR** ondansetron  (ZOFRAN ) IV, mouth rinse, traZODone   Assessment/ Plan:  [name] is a 69 y.o.  female with past medical conditions including hypertension, diabetes type 2, fibromyalgia, COPD, aortic insufficiency, and hypothyroidism, chronic kidney disease stage II, who was admitted to [facility] on 04/15/2024 for Bronchospasm [J98.01] Left bundle branch block [I44.7] Atrial fibrillation with rapid ventricular response (HCC) [I48.91] Atrial fibrillation with RVR (HCC) [I48.91] TSH elevation [R79.89] Congestive heart failure, unspecified HF chronicity, unspecified heart failure type (HCC) [I50.9]   Acute kidney injury on chronic kidney disease stage II. Baseline creatinine 1.0 with GFR 61 in August. Acute kidney injury likely multifactorial, IV diuresis and dysrhythmia. Will order renal US  for obstruction. Will hold IV diuresis for now.  Creatinine has risen today as well. No unexpected. Will continue to hold diuresis for now. No immediate need for dialysis.   Lab Results  Component Value Date   CREATININE 2.19 (H) 04/18/2024   CREATININE 1.94 (H) 04/17/2024    CREATININE 1.21 (H) 04/16/2024    Intake/Output Summary (Last 24 hours) at 04/18/2024 1242 Last data filed at 04/18/2024 0900 Gross per 24 hour  Intake 952.66 ml  Output --  Net 952.66 ml   2. Hyperkalemia/hyponatremia. S sodium 133 today with potassium 5.0. Will order Lokelma  10g.   3. Anemia of chronic kidney disease  Lab Results  Component Value Date   HGB 10.3 (L) 04/18/2024    Hgb stable, will monitor  4. Acute systolic CHF, ECHO from 1/11 shows EF 30-35 % with global hypokinesis. Questionable endocarditis, recommend TEE for evaluation. Furosemide  held   5. Secondary Hyperparathyroidism: with outpatient labs: PTH 57, phosphorus 3.5, calcium  9.0 on 12/01/23.   Lab Results  Component Value Date   CALCIUM  8.5 (L) 04/18/2024    S calcium  acceptable. Will continue to remove bone minerals daily    LOS: 2 Colbin Jovel 1/13/202612:42 PM   "

## 2024-04-18 NOTE — Care Management Important Message (Signed)
 Important Message  Patient Details  Name: Amber Stevenson MRN: [id number] Date of Birth: [birth year]   Important Message Given:  Yes - Medicare IM     Ascher Schroepfer W, CMA 04/18/2024, 11:36 AM

## 2024-04-18 NOTE — Progress Notes (Signed)
 Heart Failure Stewardship Pharmacy Note  PCP: Delfina Pao, MD PCP-Cardiologist: None  HPI: Amber Stevenson is a 69 y.o. female with depression, type II diabetes mellitus, hypertension, COPD, fibromyalgia, aortic insufficiency and hypothyroidism  who presented with worsening dyspnea. On admission, proBNP was 6481, HS-troponin was 22, TSH was 5.15, T4 was 0.93, A1c was 8, and lactic acid was 4. EKG on admission showed AF with aberrancy and a rate of 150 bpm. Chest x-ray noted findings suggesting congestive heart failure.    Pertinent cardiac history: TTE 10/2019 showed normal LVEF, moderate to severe aortic insufficiency, mild aortic stenosis, mild to moderate tricuspid insufficiency, and mild to moderate LVH. TTE this admission noted LVEF of 30-35% with global hypokinesis, mild RV dysfunction, round echodensity on the mitral valve chord which could be calcification vs endocarditis.  Pertinent Lab Values: Creatinine  Date Value Ref Range Status  04/17/2014 1.67 (H) 0.60 - 1.30 mg/dL Final   Creatinine, Ser  Date Value Ref Range Status  04/18/2024 2.19 (H) 0.44 - 1.00 mg/dL Final   BUN  Date Value Ref Range Status  04/18/2024 82 (H) 8 - 23 mg/dL Final  98/87/7983 35 (H) 7 - 18 mg/dL Final   Potassium  Date Value Ref Range Status  04/18/2024 5.0 3.5 - 5.1 mmol/L Final  04/17/2014 4.4 3.5 - 5.1 mmol/L Final   Sodium  Date Value Ref Range Status  04/18/2024 133 (L) 135 - 145 mmol/L Final  04/17/2014 133 (L) 136 - 145 mmol/L Final   B-Type Natriuretic Peptide  Date Value Ref Range Status  04/17/2014 190 (H) 0 - 125 pg/mL Final   Magnesium   Date Value Ref Range Status  04/17/2024 2.5 (H) 1.7 - 2.4 mg/dL Final    Comment:    Performed at [facility], [address] [place], [place] [address]   Hemoglobin A1C  Date Value Ref Range Status  03/28/2014 7.1 (H) 4.2 - 6.3 % Final    Comment:    The American Diabetes Association recommends that a primary goal  of therapy should be <7% and that physicians should reevaluate the treatment regimen in patients with HbA1c values consistently >8%.    Hgb A1c MFr Bld  Date Value Ref Range Status  04/16/2024 8.0 (H) 4.8 - 5.6 % Final    Comment:    (NOTE) Diagnosis of Diabetes The following HbA1c ranges recommended by the American Diabetes Association (ADA) may be used as an aid in the diagnosis of diabetes mellitus.  Hemoglobin             Suggested A1C NGSP%              Diagnosis  <5.7                   Non Diabetic  5.7-6.4                Pre-Diabetic  >6.4                   Diabetic  <7.0                   Glycemic control for                       adults with diabetes.     Digoxin  Level  Date Value Ref Range Status  04/18/2024 1.6 0.8 - 2.0 ng/mL Final    Comment:    Performed at [facility], [address] [place], [place] [address]   TSH  Date Value Ref Range Status  04/15/2024 5.150 (H) 0.350 - 4.500 uIU/mL Final    Comment:    Performed at [facility], [address] [place], [place] [address]    Vital Signs: Temp:  [97.5 F (36.4 C)-98.3 F (36.8 C)] 98 F (36.7 C) (01/13 0803) Pulse Rate:  [97-104] 104 (01/13 0405) Cardiac Rhythm: Atrial fibrillation;Bundle branch block (01/12 1900) Resp:  [19-20] 19 (01/13 0405) BP: (99-131)/(42-82) 99/82 (01/13 0803) SpO2:  [92 %-99 %] 99 % (01/13 0405) FiO2 (%):  [28 %] 28 % (01/12 1617) Weight:  [85 kg (187 lb 6.3 oz)] 85 kg (187 lb 6.3 oz) (01/13 0500)  Intake/Output Summary (Last 24 hours) at 04/18/2024 0813 Last data filed at 04/18/2024 0530 Gross per 24 hour  Intake 592.66 ml  Output --  Net 592.66 ml    Current Heart Failure Medications:  Loop diuretic: none Beta-Blocker: metoprolol  succinate 37.5 mg PO TID ACEI/ARB/ARNI: none MRA: none SGLT2i: Jardiance  25 mg daily Other: digoxin   0.25 mg IV daily  Prior to admission Heart Failure Medications:  Loop diuretic: none Beta-Blocker:  metoprolol  succinate 25 mg TID ACEI/ARB/ARNI: none MRA: none SGLT2i: Jardiance  25 mg daily Other: amlodipine  10 mg daily  Assessment: 1. Acute systolic heart failure (LVEF 30-35%) with mildly reduced RV function, due to NICM. NYHA class III symptoms.  -Symptoms: Reports shortness of breath improved, however, patient had not yet left the bed. No LEE noted. Reports orthopnea is still present, albeit to a lesser degree. -Volume: Volume is difficult to assess. Admit BNP could be elevated due to AF. Has not received IV diuretics in 24 hours, but creatinine and BUN increased today. -Hemodynamics: Lactic acidosis on admission portraying low output at that time from a combination of CHF and AF. Suspect she remains at high risk of decompensation. BP has been stable. HR ~100s. Digoxin  discontinued for supratherapeutic level.   -BB: Currently on metoprolol  succinate 25 mg TID. Rate is difficult to control. If patient decompensates again, she may require dose reduction despite HR. -ACEI/ARB/ARNI: Not appropriate at this time given AKI. Can consider adding prior to discharge. -MRA: Not appropriate at this time given AKI. Can consider adding prior to discharge. -SGLT2i: Continue Jardiance  25 mg daily  Plan: 1) Medication changes recommended at this time: -None at this time.   2) Patient assistance: -Pending  3) Education: - Patient has been educated on current HF medications and potential additions to HF medication regimen - Patient verbalizes understanding that over the next few months, these medication doses may change and more medications may be added to optimize HF regimen - Patient has been educated on basic disease state pathophysiology and goals of therapy  Please do not hesitate to reach out with questions or concerns,  Daisa Stennis, PharmD, CPP, BCPS, [facility] Heart Failure Pharmacist  Phone - [phone] 04/18/2024 8:13 AM

## 2024-04-18 NOTE — Progress Notes (Signed)
 " PROGRESS NOTE    Amber Stevenson  FMW:[id number] DOB: 07-Apr-1955 DOA: 04/15/2024 PCP: Delfina Pao, MD  Chief Complaint  Patient presents with   Shortness of Breath    Hospital Course:  Amber Stevenson is a 69 y.o. female with medical history significant for depression, type II diabetes mellitus, hypertension, COPD, fibromyalgia, aortic insufficiency and hypothyroidism presented with DOE, hypoxia.  Was seen outpatient and was prescribed antibiotics and prednisone , felt better for couple days and symptoms continue to worsen.  Patient admitted for A-fib with RVR, acute heart failure, elevated troponin with new LBBB on EKG, pneumonia, COPD exacerbation.  Hospital course as below  Subjective: Patient was examined at bedside, Son present at the bedside Reports improvement in SOB and doing better Oxygen only for comfort, no desaturation   Objective: Vitals:   04/18/24 0803 04/18/24 0815 04/18/24 1211 04/18/24 1638  BP: 99/82   117/61  Pulse:    (!) 106  Resp:  18    Temp: 98 F (36.7 C)  98.2 F (36.8 C) 98.1 F (36.7 C)  TempSrc: Oral  Oral Oral  SpO2:    93%  Weight:      Height:        Intake/Output Summary (Last 24 hours) at 04/18/2024 1908 Last data filed at 04/18/2024 0900 Gross per 24 hour  Intake 952.66 ml  Output --  Net 952.66 ml   Filed Weights   04/15/24 2214 04/17/24 0500 04/18/24 0500  Weight: 82.6 kg 85.3 kg 85 kg    Examination: GENERAL:  69 y.o.-year-old female patient lying in the bed with no acute distress.  NECK:  Supple, no jugular venous distention. No thyroid  enlargement, no tenderness.  LUNGS: Bibasilar breath sounds with bibasilar rales and diffuse expiratory wheezes with tight expiratory airflow and harsh vesicular breathing. No use of accessory muscles of respiration.  CARDIOVASCULAR: Tachycardic, irregular, S1, S2 normal. No murmurs, rubs, or gallops.  ABDOMEN: Soft, nondistended, nontender. Bowel sounds present. No organomegaly or mass.   EXTREMITIES: No pedal edema, cyanosis, or clubbing.  NEUROLOGIC: AO x 3, no gross focal deficits SKIN: No obvious rash, lesion, or ulcer  Assessment & Plan:  Atrial fibrillation with RVR S/p IV digoxin , IV Amiodarone  On Eliquis  5 mg bid Inc Metoprolol  Cardiology following, appreciate recs   Acute HFrEF, new diagnosis Echo shows EF 30-35%, global hypokinesis, moderate MR with echodensity of mitral valve chord, moderate to severe AR  Lasix  held. Continue Jardiance , metoprolol  25mg  Consider cardiac MRI once respiratory status improves Cardiology following, appreciate recs  Elevated troponin EKG with new LBBB Denies chest pain. 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 Cr increased 2.19, baseline Cr ~ 1.2 Hold diuresis US  renal negative for hydronephrosis Seen by Nephrology, appreciate recs Monitor creatinine  Hyperkalemia - resolved with temporizing measures Repeat potassium   Uncontrolled type 2 diabetes mellitus with hyperglycemia, without long-term current use of insulin  Steroid Induced hyperglycemia HbA1c 8.0 Hold metformin, glipizide  SSI  Normocytic anemia Monitor Hb  HTN Hold amlodipine , resume as BP tolerates losartan  on hold due to AKI  Hypothyroidism TSH 5.15, FT4 0.93 Continue home levothyroxine   GERD without esophagitis PPI   Dyslipidemia Continue Crestor   Anxiety Bipolar disorder On Valium  as needed On carbamazepine  400 mg -> decreased to  200mg  daily, discussed with psychiatry. No hx of seizures Dose decreased due to interactions with Eliquis , did not want to be on Coumadin  Obesity Class I Body mass index is 34.27  kg/m. Outpatient follow up for lifestyle modification and risk factor management  -- PT/OT rec SNF   DVT prophylaxis: Eliquis    Code Status: Full Code Disposition:  SNF  Consultants:  Treatment Team:  Consulting Physician: Florencio Cara BIRCH, MD Nephrology  Procedures:  None  Antimicrobials:  Anti-infectives (From admission, onward)    Start     Dose/Rate Route Frequency Ordered Stop   04/16/24 1200  cefTRIAXone  (ROCEPHIN ) 2 g in sodium chloride  0.9 % 100 mL IVPB        2 g 200 mL/hr over 30 Minutes Intravenous Every 24 hours 04/16/24 0418 04/21/24 1159   04/16/24 1000  doxycycline  (VIBRA -TABS) tablet 100 mg        100 mg Oral 2 times daily 04/16/24 0418     04/16/24 0500  cefTRIAXone  (ROCEPHIN ) 2 g in sodium chloride  0.9 % 100 mL IVPB  Status:  Discontinued        2 g 200 mL/hr over 30 Minutes Intravenous Every 24 hours 04/16/24 0450 04/16/24 0454   04/16/24 0000  cefTRIAXone  (ROCEPHIN ) 1 g in sodium chloride  0.9 % 100 mL IVPB        1 g 200 mL/hr over 30 Minutes Intravenous  Once 04/15/24 2358 04/16/24 0042   04/16/24 0000  doxycycline  (VIBRAMYCIN ) 100 mg in sodium chloride  0.9 % 250 mL IVPB        100 mg 125 mL/hr over 120 Minutes Intravenous  Once 04/15/24 2358 04/16/24 0305       Data Reviewed: I have personally reviewed following labs and imaging studies CBC: Recent Labs  Lab 04/15/24 2220 04/16/24 0450 04/17/24 0451 04/18/24 0358  WBC 14.6* 14.0* 17.8* 18.6*  NEUTROABS 9.9*  --   --   --   HGB 11.1* 10.1* 10.5* 10.3*  HCT 35.3* 32.2* 33.2* 32.5*  MCV 92.2 92.0 90.0 90.8  PLT 350 321 321 377   Basic Metabolic Panel: Recent Labs  Lab 04/15/24 2220 04/16/24 0450 04/16/24 1013 04/16/24 1802 04/17/24 0018 04/17/24 0451 04/17/24 1952 04/18/24 0358  NA 132* 131*  --   --   --  132*  --  133*  K 5.3* 5.7*   < > 5.6* 6.4* 5.4* 4.9 5.0  CL 97* 98  --   --   --  94*  --  94*  CO2 19* 20*  --   --   --  21*  --  23  GLUCOSE 305* 364*  --   --   --   194*  --  104*  BUN 47* 48*  --   --   --  67*  --  82*  CREATININE 1.30* 1.21*  --   --   --  1.94*  --  2.19*  CALCIUM  9.4 8.8*  --   --   --  8.9  --  8.5*  MG 2.4  --   --   --   --  2.5*  --   --    < > = values in this interval not displayed.   GFR: Estimated Creatinine Clearance: 24.2 mL/min (A) (by C-G formula based on SCr of 2.19 mg/dL (H)). Liver Function Tests: Recent Labs  Lab 04/15/24 2220  AST 20  ALT 23  ALKPHOS 72  BILITOT 0.4  PROT 8.0  ALBUMIN  4.4   CBG: Recent Labs  Lab 04/17/24 1753 04/17/24 2027 04/18/24 0801 04/18/24 1206 04/18/24 1637  GLUCAP 142* 91 105* 167* 212*    Recent Results (from the past 240 hours)  Resp panel by RT-PCR (RSV, Flu A&B, Covid) Anterior Nasal Swab     Status: None   Collection Time: 04/15/24 10:20 PM   Specimen: Anterior Nasal Swab  Result Value Ref Range Status   SARS Coronavirus 2 by RT PCR NEGATIVE NEGATIVE Final    Comment: (NOTE) SARS-CoV-2 target nucleic acids are NOT DETECTED.  The SARS-CoV-2 RNA is generally detectable in upper respiratory specimens during the acute phase of infection. The lowest concentration of SARS-CoV-2 viral copies this assay can detect is 138 copies/mL. A negative result does not preclude SARS-Cov-2 infection and should not be used as the sole basis for treatment or other patient management decisions. A negative result may occur with  improper specimen collection/handling, submission of specimen other than nasopharyngeal swab, presence of viral mutation(s) within the areas targeted by this assay, and inadequate number of viral copies(<138 copies/mL). A negative result must be combined with clinical observations, patient history, and epidemiological information. The expected result is Negative.  Fact Sheet for Patients:  bloggercourse.com  Fact Sheet for Healthcare Providers:  seriousbroker.it  This test is no t yet approved or  cleared by the [place]  FDA and  has been authorized for detection and/or diagnosis of SARS-CoV-2 by FDA under an Emergency Use Authorization (EUA). This EUA will remain  in effect (meaning this test can be used) for the duration of the COVID-19 declaration under Section 564(b)(1) of the Act, 21 U.S.C.section 360bbb-3(b)(1), unless the authorization is terminated  or revoked sooner.       Influenza A by PCR NEGATIVE NEGATIVE Final   Influenza B by PCR NEGATIVE NEGATIVE Final    Comment: (NOTE) The Xpert Xpress SARS-CoV-2/FLU/RSV plus assay is intended as an aid in the diagnosis of influenza from Nasopharyngeal swab specimens and should not be used as a sole basis for treatment. Nasal washings and aspirates are unacceptable for Xpert Xpress SARS-CoV-2/FLU/RSV testing.  Fact Sheet for Patients: bloggercourse.com  Fact Sheet for Healthcare Providers: seriousbroker.it  This test is not yet approved or cleared by the [place]  FDA and has been authorized for detection and/or diagnosis of SARS-CoV-2 by FDA under an Emergency Use Authorization (EUA). This EUA will remain in effect (meaning this test can be used) for the duration of the COVID-19 declaration under Section 564(b)(1) of the Act, 21 U.S.C. section 360bbb-3(b)(1), unless the authorization is terminated or revoked.     Resp Syncytial Virus by PCR NEGATIVE NEGATIVE Final    Comment: (NOTE) Fact Sheet for Patients: bloggercourse.com  Fact Sheet for Healthcare Providers: seriousbroker.it  This test is not yet approved or cleared by the [place]  FDA and has been authorized for detection and/or diagnosis of SARS-CoV-2 by FDA under an Emergency Use Authorization (EUA). This EUA will remain in effect (meaning this test can be used) for the duration of the COVID-19 declaration under Section 564(b)(1) of the Act, 21  U.S.C. section 360bbb-3(b)(1), unless the authorization is terminated or revoked.  Performed at [facility], [address] [place], [place] [address]   Blood culture (routine x 2)     Status: None (Preliminary result)   Collection Time: 04/15/24 10:20 PM   Specimen: BLOOD  Result Value Ref Range Status   Specimen Description BLOOD BLOOD LEFT ARM  Final   Special Requests  Final    BOTTLES DRAWN AEROBIC AND ANAEROBIC Blood Culture adequate volume   Culture   Final    NO GROWTH 3 DAYS Performed at [facility], [address] [place], [place] [address]    Report Status PENDING  Incomplete  Blood culture (routine x 2)     Status: None (Preliminary result)   Collection Time: 04/15/24 10:20 PM   Specimen: BLOOD  Result Value Ref Range Status   Specimen Description BLOOD BLOOD RIGHT FOREARM  Final   Special Requests   Final    BOTTLES DRAWN AEROBIC AND ANAEROBIC Blood Culture adequate volume   Culture   Final    NO GROWTH 3 DAYS Performed at [facility], [address], [place], [place] [address]    Report Status PENDING  Incomplete  Respiratory (~20 pathogens) panel by PCR     Status: None   Collection Time: 04/16/24  3:01 PM   Specimen: Nasopharyngeal Swab; Respiratory  Result Value Ref Range Status   Adenovirus NOT DETECTED NOT DETECTED Final   Coronavirus 229E NOT DETECTED NOT DETECTED Final    Comment: (NOTE) The Coronavirus on the Respiratory Panel, DOES NOT test for the novel  Coronavirus (2019 nCoV)    Coronavirus HKU1 NOT DETECTED NOT DETECTED Final   Coronavirus NL63 NOT DETECTED NOT DETECTED Final   Coronavirus OC43 NOT DETECTED NOT DETECTED Final   Metapneumovirus NOT DETECTED NOT DETECTED Final   Rhinovirus / Enterovirus NOT DETECTED NOT DETECTED Final   Influenza A NOT DETECTED NOT DETECTED Final   Influenza B NOT DETECTED NOT DETECTED Final   Parainfluenza Virus 1 NOT DETECTED NOT DETECTED Final   Parainfluenza Virus 2 NOT  DETECTED NOT DETECTED Final   Parainfluenza Virus 3 NOT DETECTED NOT DETECTED Final   Parainfluenza Virus 4 NOT DETECTED NOT DETECTED Final   Respiratory Syncytial Virus NOT DETECTED NOT DETECTED Final   Bordetella pertussis NOT DETECTED NOT DETECTED Final   Bordetella Parapertussis NOT DETECTED NOT DETECTED Final   Chlamydophila pneumoniae NOT DETECTED NOT DETECTED Final   Mycoplasma pneumoniae NOT DETECTED NOT DETECTED Final    Comment: Performed at [facility], [address], [place], [place] [address]     Radiology Studies: US  RENAL Result Date: 04/17/2024 EXAM: RETROPERITONEAL ULTRASOUND OF THE KIDNEYS 04/17/2024 12:02:58 PM TECHNIQUE: Real-time ultrasonography of the retroperitoneum, specifically the kidneys and urinary bladder, was performed. COMPARISON: None available. CLINICAL HISTORY: Acute kidney failure. FINDINGS: RIGHT KIDNEY: Right kidney measures 10.4 x 4.4 x 4.7 cm. Mild cortical thinning is noted. Calculated volume is 113 ml. No hydronephrosis. No mass. LEFT KIDNEY: Left kidney measures 9.7 x 5.8 x 4.4 cm. Calculated volume is 128 ml. Mild cortical thinning is noted. No hydronephrosis. No mass. BLADDER: Decompressed INCIDENTAL FINDINGS: Small bilateral pleural effusions. IMPRESSION: 1. Mild cortical thinning in both kidneys. 2. Small bilateral pleural effusions. Electronically signed by: Oneil Devonshire MD MD 04/17/2024 11:43 PM EST RP Workstation: [id number]    Scheduled Meds:  apixaban   5 mg Oral BID   budesonide  (PULMICORT ) nebulizer solution  0.25 mg Nebulization BID   carbamazepine   200 mg Oral Daily   cholecalciferol   2,000 Units Oral Daily   [START ON 04/19/2024] digoxin   0.0625 mg Oral Daily   doxycycline   100 mg Oral BID   DULoxetine   60 mg Oral Daily   empagliflozin   25 mg Oral Daily   guaiFENesin   600 mg Oral BID   insulin  aspart  0-15 Units Subcutaneous TID WC  insulin  aspart  0-5 Units Subcutaneous QHS   levothyroxine   112 mcg Oral Q0600   metoprolol   succinate  37.5 mg Oral TID   pantoprazole   40 mg Oral Daily   predniSONE   40 mg Oral Q breakfast   rosuvastatin   40 mg Oral Daily   saccharomyces boulardii  250 mg Oral BID   Continuous Infusions:  amiodarone  30 mg/hr (04/17/24 2315)   cefTRIAXone  (ROCEPHIN )  IV 2 g (04/18/24 1205)     LOS: 2 days  MDM: Patient is high risk for one or more organ failure.  They necessitate ongoing hospitalization for continued IV therapies and subsequent lab monitoring. Total time spent interpreting labs and vitals, reviewing the medical record, coordinating care amongst consultants and care team members, directly assessing and discussing care with the patient and/or family: 55 min Laree Lock, MD [facility]  To contact the attending physician between 7A-7P please use Epic Chat. To contact the covering physician during after hours 7P-7A, please review Amion.  04/18/2024, 7:08 PM   *This document has been created with the assistance of dictation software. Please excuse typographical errors. *   "

## 2024-04-18 NOTE — Plan of Care (Signed)
 VSS. RA. Patient remains on amio gtt. Family at bedside this shift. Therapy worked with patient. See note. Cards following patient. See note. Vascular placed new PIV.  Problem: Education: Goal: Ability to describe self-care measures that may prevent or decrease complications (Diabetes Survival Skills Education) will improve Outcome: Progressing Goal: Individualized Educational Video(s) Outcome: Progressing   Problem: Coping: Goal: Ability to adjust to condition or change in health will improve Outcome: Progressing   Problem: Fluid Volume: Goal: Ability to maintain a balanced intake and output will improve Outcome: Progressing   Problem: Health Behavior/Discharge Planning: Goal: Ability to identify and utilize available resources and services will improve Outcome: Progressing Goal: Ability to manage health-related needs will improve Outcome: Progressing   Problem: Metabolic: Goal: Ability to maintain appropriate glucose levels will improve Outcome: Progressing   Problem: Nutritional: Goal: Maintenance of adequate nutrition will improve Outcome: Progressing Goal: Progress toward achieving an optimal weight will improve Outcome: Progressing   Problem: Skin Integrity: Goal: Risk for impaired skin integrity will decrease Outcome: Progressing   Problem: Tissue Perfusion: Goal: Adequacy of tissue perfusion will improve Outcome: Progressing   Problem: Fluid Volume: Goal: Hemodynamic stability will improve Outcome: Progressing   Problem: Clinical Measurements: Goal: Diagnostic test results will improve Outcome: Progressing Goal: Signs and symptoms of infection will decrease Outcome: Progressing   Problem: Respiratory: Goal: Ability to maintain adequate ventilation will improve Outcome: Progressing   Problem: Education: Goal: Knowledge of General Education information will improve Description: Including pain rating scale, medication(s)/side effects and non-pharmacologic  comfort measures Outcome: Progressing   Problem: Health Behavior/Discharge Planning: Goal: Ability to manage health-related needs will improve Outcome: Progressing   Problem: Clinical Measurements: Goal: Ability to maintain clinical measurements within normal limits will improve Outcome: Progressing Goal: Will remain free from infection Outcome: Progressing Goal: Diagnostic test results will improve Outcome: Progressing Goal: Respiratory complications will improve Outcome: Progressing Goal: Cardiovascular complication will be avoided Outcome: Progressing   Problem: Activity: Goal: Risk for activity intolerance will decrease Outcome: Progressing   Problem: Nutrition: Goal: Adequate nutrition will be maintained Outcome: Progressing   Problem: Coping: Goal: Level of anxiety will decrease Outcome: Progressing   Problem: Elimination: Goal: Will not experience complications related to bowel motility Outcome: Progressing Goal: Will not experience complications related to urinary retention Outcome: Progressing   Problem: Pain Managment: Goal: General experience of comfort will improve and/or be controlled Outcome: Progressing   Problem: Safety: Goal: Ability to remain free from injury will improve Outcome: Progressing   Problem: Skin Integrity: Goal: Risk for impaired skin integrity will decrease Outcome: Progressing

## 2024-04-18 NOTE — TOC Progression Note (Signed)
 Transition of Care [facility]) - Progression Note    Patient Details  Name: Amber Stevenson MRN: [id number] Date of Birth: 1955/08/27  [facility] CM/SW Contact  Corean ONEIDA Haddock, RN Phone Number: 04/18/2024, 4:57 PM  Clinical Narrative:     TOC assessment completed full note to follow                       Expected Discharge Plan and Services                                               Social Drivers of Health (SDOH) Interventions SDOH Screenings   Food Insecurity: No Food Insecurity (04/16/2024)  Housing: Low Risk (04/16/2024)  Transportation Needs: No Transportation Needs (04/16/2024)  Utilities: Not At Risk (04/16/2024)  Financial Resource Strain: Low Risk  (12/01/2023)   Received from [facility]  Social Connections: Patient Declined (04/16/2024)  Tobacco Use: Medium Risk (04/15/2024)    Readmission Risk Interventions     No data to display

## 2024-04-18 NOTE — Progress Notes (Signed)
 [facility] CLINIC CARDIOLOGY PROGRESS NOTE   Patient ID: Amber Stevenson MRN: [id number] DOB/AGE: [birth year] 69 y.o.  Admit date: 04/15/2024 Referring Physician Dr. Madison Peaches Primary Physician Delfina Pao, MD  Primary Cardiologist Dr. Ammon (last seen 2022) Reason for Consultation wide-complex tachycardia, acute heart failure  HPI: Amber Stevenson is a 69 y.o. female with a past medical history of hypertension, COPD, type 2 diabetes, depression who presented to the ED on 04/15/2024 for worsening shortness of breath.  Initial EKG with wide-complex tachycardia, A-fib with aberrancy.  Cardiology was consulted for further evaluation.   Interval History:  - Patient seen and examined this morning, resting comfortably in hospital bed with family at bedside. - reports feeling a little better today. Denies CP. SOB improving. - Creatinine continues to uptrend today, nephro following.  Review of systems complete and found to be negative unless listed above   Vitals:   04/18/24 0405 04/18/24 0500 04/18/24 0803 04/18/24 0815  BP: 131/71  99/82   Pulse: (!) 104     Resp: 19   18  Temp: (!) 97.5 F (36.4 C)  98 F (36.7 C)   TempSrc:   Oral   SpO2: 99%     Weight:  85 kg    Height:         Intake/Output Summary (Last 24 hours) at 04/18/2024 9061 Last data filed at 04/18/2024 0805 Gross per 24 hour  Intake 832.66 ml  Output --  Net 832.66 ml     PHYSICAL EXAM General: Chronically ill appearing female, well nourished, in no acute distress. HEENT: Normocephalic and atraumatic. Neck: No JVD.  Lungs: Normal respiratory effort on 2L [place]. Clear bilaterally to auscultation. No wheezes, crackles, rhonchi.  Heart: Irregularly irregular, controlled rate. Normal S1 and S2 without gallops or murmurs. Radial & DP pulses 2+ bilaterally. Abdomen: Non-distended appearing.  Msk: Normal strength and tone for age. Extremities: No clubbing, cyanosis or edema.   Neuro: Alert and oriented X 3. Psych:  Mood appropriate, affect congruent.    LABS: Basic Metabolic Panel: Recent Labs    04/15/24 2220 04/16/24 0450 04/17/24 0451 04/17/24 1952 04/18/24 0358  NA 132*   < > 132*  --  133*  K 5.3*   < > 5.4* 4.9 5.0  CL 97*   < > 94*  --  94*  CO2 19*   < > 21*  --  23  GLUCOSE 305*   < > 194*  --  104*  BUN 47*   < > 67*  --  82*  CREATININE 1.30*   < > 1.94*  --  2.19*  CALCIUM  9.4   < > 8.9  --  8.5*  MG 2.4  --  2.5*  --   --    < > = values in this interval not displayed.   Liver Function Tests: Recent Labs    04/15/24 2220  AST 20  ALT 23  ALKPHOS 72  BILITOT 0.4  PROT 8.0  ALBUMIN 4.4   No results for input(s): LIPASE, AMYLASE in the last 72 hours. CBC: Recent Labs    04/15/24 2220 04/16/24 0450 04/17/24 0451 04/18/24 0358  WBC 14.6*   < > 17.8* 18.6*  NEUTROABS 9.9*  --   --   --   HGB 11.1*   < > 10.5* 10.3*  HCT 35.3*   < > 33.2* 32.5*  MCV 92.2   < > 90.0 90.8  PLT 350   < > 321 377   < > =  values in this interval not displayed.   Cardiac Enzymes: No results for input(s): CKTOTAL, CKMB, CKMBINDEX, TROPONINIHS in the last 72 hours. BNP: No results for input(s): BNP in the last 72 hours. D-Dimer: No results for input(s): DDIMER in the last 72 hours. Hemoglobin A1C: Recent Labs    04/16/24 0449  HGBA1C 8.0*   Fasting Lipid Panel: No results for input(s): CHOL, HDL, LDLCALC, TRIG, CHOLHDL, LDLDIRECT in the last 72 hours. Thyroid  Function Tests: Recent Labs    04/15/24 2220  TSH 5.150*   Anemia Panel: No results for input(s): VITAMINB12, FOLATE, FERRITIN, TIBC, IRON, RETICCTPCT in the last 72 hours.  US  RENAL Result Date: 04/17/2024 EXAM: RETROPERITONEAL ULTRASOUND OF THE KIDNEYS 04/17/2024 12:02:58 PM TECHNIQUE: Real-time ultrasonography of the retroperitoneum, specifically the kidneys and urinary bladder, was performed. COMPARISON: None available. CLINICAL HISTORY: Acute kidney failure. FINDINGS: RIGHT  KIDNEY: Right kidney measures 10.4 x 4.4 x 4.7 cm. Mild cortical thinning is noted. Calculated volume is 113 ml. No hydronephrosis. No mass. LEFT KIDNEY: Left kidney measures 9.7 x 5.8 x 4.4 cm. Calculated volume is 128 ml. Mild cortical thinning is noted. No hydronephrosis. No mass. BLADDER: Decompressed INCIDENTAL FINDINGS: Small bilateral pleural effusions. IMPRESSION: 1. Mild cortical thinning in Stevenson kidneys. 2. Small bilateral pleural effusions. Electronically signed by: Oneil Devonshire MD MD 04/17/2024 11:43 PM EST RP Workstation: [id number]   ECHOCARDIOGRAM COMPLETE Result Date: 04/16/2024    ECHOCARDIOGRAM REPORT   Patient Name:   Amber Stevenson Date of Exam: 04/16/2024 Medical Rec #:  [id number]       Height:       62.0 in Accession #:    [id number]      Weight:       182.0 lb Date of Birth:  Jun 06, 1955       BSA:          1.837 m Patient Age:    68 years        BP:           156/70 mmHg Patient Gender: F               HR:           116 bpm. Exam Location:  Inpatient Procedure: 2D Echo, Color Doppler, Cardiac Doppler and Intracardiac            Opacification Agent (Stevenson Spectral and Color Flow Doppler were            utilized during procedure). Indications:     CHF-Acute Diastolic  History:         Patient has no prior history of Echocardiogram examinations.                  Risk Factors:Hypertension, Diabetes, Former Smoker and Thyroid                   Disease.  Sonographer:     Logan Shove RDCS Referring Phys:  [id number] JAN A MANSY Diagnosing Phys: Annabella Scarce MD  Sonographer Comments: Technically difficult study due to poor echo windows. IMPRESSIONS  1. Left ventricular ejection fraction, by estimation, is 30 to 35%. The left ventricle has moderately decreased function. The left ventricle demonstrates global hypokinesis. Left ventricular diastolic parameters are indeterminate.  2. Right ventricular systolic function is mildly reduced. The right ventricular size is normal. There is moderately elevated  pulmonary artery systolic pressure.  3. Left atrial size was mildly dilated.  4. There is a round echodensity on the mitral valve chord measuring  0.8 x 0.5 cm. This likely represents calcification. Cannot rule out endocarditis. Consider TEE or cardiac MRI to better evaluate. The mitral valve is degenerative. Moderate mitral valve  regurgitation. No evidence of mitral stenosis. The mean mitral valve gradient is 3.3 mmHg with average heart rate of 119 bpm.  5. Tricuspid valve regurgitation is mild to moderate.  6. The aortic valve is normal in structure. Aortic valve regurgitation is moderate to severe. No aortic stenosis is present. Aortic regurgitation PHT measures 237 msec.  7. The inferior vena cava is dilated in size with <50% respiratory variability, suggesting right atrial pressure of 15 mmHg. FINDINGS  Left Ventricle: Left ventricular ejection fraction, by estimation, is 30 to 35%. The left ventricle has moderately decreased function. The left ventricle demonstrates global hypokinesis. Definity  contrast agent was given IV to delineate the left ventricular endocardial borders. The left ventricular internal cavity size was normal in size. There is no left ventricular hypertrophy. Left ventricular diastolic parameters are indeterminate. Right Ventricle: The right ventricular size is normal. No increase in right ventricular wall thickness. Right ventricular systolic function is mildly reduced. There is moderately elevated pulmonary artery systolic pressure. The tricuspid regurgitant velocity is 3.34 m/s, and with an assumed right atrial pressure of 15 mmHg, the estimated right ventricular systolic pressure is 59.6 mmHg. Left Atrium: Left atrial size was mildly dilated. Right Atrium: Right atrial size was normal in size. Pericardium: There is no evidence of pericardial effusion. Mitral Valve: There is a round echodensity on the mitral valve chord measuring 0.8 x 0.5 cm. This likely represents calcification. Cannot  rule out endocarditis. Consider TEE or cardiac MRI to better evaluate. The mitral valve is degenerative in appearance. There is moderate thickening of the mitral valve leaflet(s). There is moderate calcification of the mitral valve leaflet(s). Moderate mitral valve regurgitation. No evidence of mitral valve stenosis. The mean mitral valve gradient is 3.3 mmHg  with average heart rate of 119 bpm. Tricuspid Valve: The tricuspid valve is normal in structure. Tricuspid valve regurgitation is mild to moderate. No evidence of tricuspid stenosis. Aortic Valve: The aortic valve is normal in structure. Aortic valve regurgitation is moderate to severe. Aortic regurgitation PHT measures 237 msec. No aortic stenosis is present. Aortic valve peak gradient measures 13.5 mmHg. Pulmonic Valve: The pulmonic valve was normal in structure. Pulmonic valve regurgitation is not visualized. No evidence of pulmonic stenosis. Aorta: The aortic root is normal in size and structure. Venous: The inferior vena cava is dilated in size with less than 50% respiratory variability, suggesting right atrial pressure of 15 mmHg. IAS/Shunts: No atrial level shunt detected by color flow Doppler.  LEFT VENTRICLE PLAX 2D LVIDd:         4.10 cm LVIDs:         3.30 cm LV PW:         0.90 cm LV IVS:        0.80 cm LVOT diam:     1.90 cm LVOT Area:     2.84 cm  LV Volumes (MOD) LV vol d, MOD A2C: 143.0 ml LV vol d, MOD A4C: 106.0 ml LV vol s, MOD A2C: 92.9 ml LV vol s, MOD A4C: 67.7 ml LV SV MOD A2C:     50.1 ml LV SV MOD A4C:     106.0 ml LV SV MOD BP:      42.2 ml RIGHT VENTRICLE            IVC RV Basal diam:  3.50  cm    IVC diam: 2.00 cm RV S prime:     7.34 cm/s TAPSE (M-mode): 1.8 cm LEFT ATRIUM             Index        RIGHT ATRIUM           Index LA diam:        3.80 cm 2.07 cm/m   RA Area:     17.40 cm LA Vol (A2C):   88.7 ml 48.30 ml/m  RA Volume:   45.00 ml  24.50 ml/m LA Vol (A4C):   31.7 ml 17.26 ml/m LA Biplane Vol: 54.4 ml 29.62 ml/m   AORTIC VALVE AV Area (Vmax): 1.53 cm AV Vmax:        183.60 cm/s AV Peak Grad:   13.5 mmHg LVOT Vmax:      98.98 cm/s AI PHT:         237 msec  AORTA Ao Root diam: 2.30 cm Ao Asc diam:  3.20 cm MITRAL VALVE                  TRICUSPID VALVE MV Mean grad: 3.3 mmHg        TR Peak grad:   44.6 mmHg MR Peak grad:    90.6 mmHg    TR Mean grad:   26.0 mmHg MR Mean grad:    56.0 mmHg    TR Vmax:        334.00 cm/s MR Vmax:         476.00 cm/s  TR Vmean:       232.0 cm/s MR Vmean:        350.0 cm/s MR PISA:         5.09 cm     SHUNTS MR PISA Eff ROA: 41 mm       Systemic Diam: 1.90 cm MR PISA Radius:  0.90 cm Annabella Scarce MD Electronically signed by Annabella Scarce MD Signature Date/Time: 04/16/2024/12:54:51 PM    Final      ECHO as above  TELEMETRY (personally reviewed): atrial fibrillation LBBB rate 100-110s  EKG (personally reviewed): Atrial fibrillation RVR LBBB rate 150 bpm  DATA reviewed by me 04/18/2024: last 24h vitals tele labs imaging I/O, hospitalist progress note  Principal Problem:   Atrial fibrillation with RVR (HCC) Active Problems:   Acute CHF (congestive heart failure) (HCC)   CAP (community acquired pneumonia)   Dyslipidemia   AKI (acute kidney injury)   Uncontrolled type 2 diabetes mellitus with hyperglycemia, without long-term current use of insulin  (HCC)   GERD without esophagitis   COPD with acute exacerbation (HCC)    ASSESSMENT AND PLAN: Amber Stevenson is a 69 y.o. female with a past medical history of hypertension, COPD, type 2 diabetes, depression who presented to the ED on 04/15/2024 for worsening shortness of breath.  Initial EKG with wide-complex tachycardia, A-fib with aberrancy.  Cardiology was consulted for further evaluation.   # Atrial fibrillation RVR with abberancy # Acute HFrEF # Community acquired pneumonia # Acute COPD exacerbation # AKI on CKD Patient presented with worsening shortness of breath, also found to be in wide-complex tachycardia on EKG  which appears to be atrial fibrillation with aberrancy.  Rate overall improved with IV amiodarone  and digoxin .  Echo this admission with a EF 30-35%, global hypokinesis, moderate MR with echodensity of mitral valve chord, moderate to severe AR. - Continue IV amiodarone  infusion. Increase metoprolol  succinate to 37.5 mg three times daily as BP  allows.  - Dig level 1.6 this AM, will hold off on dose today and resume PO at 0.0625 mg tomorrow.  - Continue Eliquis  5 mg twice daily.  This plan was discussed with pharmacy as she is currently on carbamazepine  which has a DDI.  Primary team discussed with psych who advised it was ok to decrease carbamazepine  dose.  - Holding off on further IV diuresis.  Nephrology has been consulted, appreciate their evaluation. - Continue Jardiance  25 mg daily. - Continue Crestor  40 mg daily. - Will consider cardiac MRI this admission when respiratory status improves and she is able to tolerate laying flat for an extended period.  This patient's case was discussed and created with Dr. Florencio and he is in agreement.  Signed:  Danita Bloch, PA-C  04/18/2024, 9:38 AM [facility]

## 2024-04-18 NOTE — Plan of Care (Signed)

## 2024-04-18 NOTE — Evaluation (Signed)
 Physical Therapy Evaluation Patient Details Name: Amber Stevenson MRN: [id number] DOB: 10/13/1955 Today's Date: 04/18/2024  History of Present Illness  Pt is a 69 y.o. female with depression, type II diabetes mellitus, hypertension, COPD, fibromyalgia, aortic insufficiency and hypothyroidism  who presented with worsening dyspnea. Chest x-ray noted findings suggesting congestive heart failure.   Clinical Impression  Pt alert, agreeable to PT, family at bedside. At baseline the pt is ambulatory, independent for ADLs. She was able to perform bed mobility with supervision and bed rails. Sit <> Stand with several attempts, CGA with RW. She ambulated ~60ft with RW, cGA-minA several instances of loss of balance noted, verbal cues to improve safety and use of RW. Pt very fatigued after returning to recliner (able to perform pericare in standing with minA), HR in 120s.  Overall the patient demonstrated deficits (see PT Problem List) that impede the patient's functional abilities, safety, and mobility and would benefit from skilled PT intervention.          If plan is discharge home, recommend the following: A little help with walking and/or transfers;A little help with bathing/dressing/bathroom;Assistance with cooking/housework;Assist for transportation;Help with stairs or ramp for entrance   Can travel by private vehicle   Yes    Equipment Recommendations Other (comment) (TBD)  Recommendations for Other Services       Functional Status Assessment Patient has had a recent decline in their functional status and demonstrates the ability to make significant improvements in function in a reasonable and predictable amount of time.     Precautions / Restrictions Precautions Precautions: Fall Recall of Precautions/Restrictions: Impaired Restrictions Weight Bearing Restrictions Per Provider Order: No      Mobility  Bed Mobility Overal bed mobility: Needs Assistance Bed Mobility: Supine to Sit,  Sit to Supine     Supine to sit: Supervision, Used rails, HOB elevated          Transfers   Equipment used: Rolling walker (2 wheels) Transfers: Sit to/from Stand Sit to Stand: Contact guard assist           General transfer comment: several attempts to complete safely    Ambulation/Gait Ambulation/Gait assistance: Min assist, Contact guard assist Gait Distance (Feet): 30 Feet Assistive device: Rolling walker (2 wheels)         General Gait Details: ambulatory to bathroom and back. several LOB minA to correct  Stairs            Wheelchair Mobility     Tilt Bed    Modified Rankin (Stroke Patients Only)       Balance Overall balance assessment: Needs assistance Sitting-balance support: Feet supported Sitting balance-Leahy Scale: Good     Standing balance support: Single extremity supported, During functional activity, Reliant on assistive device for balance Standing balance-Leahy Scale: Fair                               Pertinent Vitals/Pain Pain Assessment Pain Assessment: No/denies pain    Home Living Family/patient expects to be discharged to:: Private residence Living Arrangements: Children (son, grand daugthe) Available Help at Discharge: Family;Available 24 hours/day Type of Home: Apartment Home Access: Stairs to enter   Entrance Stairs-Number of Steps: 1 step, ramp   Home Layout: One level Home Equipment: Cane - quad Additional Comments: pt reports she has a RW from 20+ yrs ago    Prior Function Prior Level of Function : Independent/Modified Independent  Mobility Comments: amb with no AD ADLs Comments: generally MOD I with ADLs, grand daugther helps with step into the shower; assist for IADLs as needed     Extremity/Trunk Assessment   Upper Extremity Assessment Upper Extremity Assessment: Overall WFL for tasks assessed    Lower Extremity Assessment Lower Extremity Assessment: Generalized  weakness       Communication   Communication Communication: No apparent difficulties    Cognition Arousal: Alert Behavior During Therapy: Anxious, WFL for tasks assessed/performed                             Following commands: Intact       Cueing Cueing Techniques: Verbal cues, Tactile cues     General Comments      Exercises     Assessment/Plan    PT Assessment Patient needs continued PT services  PT Problem List Decreased balance;Decreased mobility;Decreased strength;Decreased activity tolerance;Decreased knowledge of precautions       PT Treatment Interventions DME instruction;Balance training;Gait training;Neuromuscular re-education;Stair training;Functional mobility training;Patient/family education;Therapeutic activities;Therapeutic exercise    PT Goals (Current goals can be found in the Care Plan section)  Acute Rehab PT Goals Patient Stated Goal: to return to PLOF PT Goal Formulation: With patient Time For Goal Achievement: 05/02/24 Potential to Achieve Goals: Good    Frequency Min 2X/week     Co-evaluation               AM-PAC PT 6 Clicks Mobility  Outcome Measure Help needed turning from your back to your side while in a flat bed without using bedrails?: A Little Help needed moving from lying on your back to sitting on the side of a flat bed without using bedrails?: A Little Help needed moving to and from a bed to a chair (including a wheelchair)?: A Little Help needed standing up from a chair using your arms (e.g., wheelchair or bedside chair)?: A Little Help needed to walk in hospital room?: A Little Help needed climbing 3-5 steps with a railing? : A Lot 6 Click Score: 17    End of Session   Activity Tolerance: Patient tolerated treatment well Patient left: in chair;with call bell/phone within reach;with chair alarm set Nurse Communication: Mobility status PT Visit Diagnosis: Other abnormalities of gait and mobility  (R26.89);Difficulty in walking, not elsewhere classified (R26.2);Muscle weakness (generalized) (M62.81)    Time: 8983-8962 PT Time Calculation (min) (ACUTE ONLY): 21 min   Charges:   PT Evaluation $PT Eval Low Complexity: 1 Low PT Treatments $Therapeutic Activity: 8-22 mins PT General Charges $$ ACUTE PT VISIT: 1 Visit         Doyal Shams PT, DPT 11:56 AM,04/18/2024

## 2024-04-19 DIAGNOSIS — J441 Chronic obstructive pulmonary disease with (acute) exacerbation: Secondary | ICD-10-CM

## 2024-04-19 DIAGNOSIS — E1165 Type 2 diabetes mellitus with hyperglycemia: Secondary | ICD-10-CM | POA: Diagnosis not present

## 2024-04-19 DIAGNOSIS — I509 Heart failure, unspecified: Secondary | ICD-10-CM | POA: Diagnosis not present

## 2024-04-19 DIAGNOSIS — I4891 Unspecified atrial fibrillation: Secondary | ICD-10-CM | POA: Diagnosis not present

## 2024-04-19 LAB — CBC
HCT: 32.7 % — ABNORMAL LOW (ref 36.0–46.0)
Hemoglobin: 10.6 g/dL — ABNORMAL LOW (ref 12.0–15.0)
MCH: 28.9 pg (ref 26.0–34.0)
MCHC: 32.4 g/dL (ref 30.0–36.0)
MCV: 89.1 fL (ref 80.0–100.0)
Platelets: 286 K/uL (ref 150–400)
RBC: 3.67 MIL/uL — ABNORMAL LOW (ref 3.87–5.11)
RDW: 15.4 % (ref 11.5–15.5)
WBC: 12.6 K/uL — ABNORMAL HIGH (ref 4.0–10.5)
nRBC: 0 % (ref 0.0–0.2)

## 2024-04-19 LAB — BASIC METABOLIC PANEL WITH GFR
Anion gap: 15 (ref 5–15)
BUN: 70 mg/dL — ABNORMAL HIGH (ref 8–23)
CO2: 23 mmol/L (ref 22–32)
Calcium: 8.2 mg/dL — ABNORMAL LOW (ref 8.9–10.3)
Chloride: 97 mmol/L — ABNORMAL LOW (ref 98–111)
Creatinine, Ser: 1.59 mg/dL — ABNORMAL HIGH (ref 0.44–1.00)
GFR, Estimated: 35 mL/min — ABNORMAL LOW
Glucose, Bld: 160 mg/dL — ABNORMAL HIGH (ref 70–99)
Potassium: 4.3 mmol/L (ref 3.5–5.1)
Sodium: 135 mmol/L (ref 135–145)

## 2024-04-19 LAB — GLUCOSE, CAPILLARY
Glucose-Capillary: 224 mg/dL — ABNORMAL HIGH (ref 70–99)
Glucose-Capillary: 252 mg/dL — ABNORMAL HIGH (ref 70–99)
Glucose-Capillary: 234 mg/dL — ABNORMAL HIGH (ref 70–99)
Glucose-Capillary: 162 mg/dL — ABNORMAL HIGH (ref 70–99)

## 2024-04-19 MED ORDER — METOPROLOL SUCCINATE ER 50 MG PO TB24
50.0000 mg | ORAL_TABLET | Freq: Three times a day (TID) | ORAL | Status: DC
  Administered 2024-04-19 (×3): 50 mg via ORAL
  Filled 2024-04-19 (×3): qty 1

## 2024-04-19 NOTE — Discharge Instructions (Addendum)

## 2024-04-19 NOTE — TOC Initial Note (Addendum)
 Transition of Care [facility]) - Initial/Assessment Note    Patient Details  Name: Amber Stevenson MRN: [id number] Date of Birth: Sep 20, 1955  [facility] CM/SW Contact:    Corean ONEIDA Haddock, RN Phone Number: 04/19/2024, 8:43 AM  Clinical Narrative:                  Admitted for: afib rvr Admitted from: home with son Roselyn and daughter in law PCP: Delfina, son transports to appointments  Current home health/prior home health/DME: Quad cane, per son they have have a RW he is going to check the home  Entered room, patient sleeping soundly.  Son Roselyn and granddaughter at bedside.   Son states that he and his daughter live with patient, and someone is always at the home with her.  Reviewed recommendations of SNF.  Son states that they haven't fixed everything with her heart and kidneys yet, so I don't think we are there yet.  I explained to him that discharge planning is started on admission, and can be adjusted on the patients needs.  He states at baseline patient is fully independent at home.  He states that they would not want her to go to SNF, and he is hopefull that she will progress to go home without services.  He states if needed at discharge they would consider HH.   Reviewed recs for DME.  At this time he declines stating he does not think they will be indicated at discharge.   IP Care Management to follow        Patient Goals and CMS Choice            Expected Discharge Plan and Services                                              Prior Living Arrangements/Services                       Activities of Daily Living   ADL Screening (condition at time of admission) Independently performs ADLs?: Yes (appropriate for developmental age) Is the patient deaf or have difficulty hearing?: No Does the patient have difficulty seeing, even when wearing glasses/contacts?: No Does the patient have difficulty concentrating, remembering, or making  decisions?: No  Permission Sought/Granted                  Emotional Assessment              Admission diagnosis:  Bronchospasm [J98.01] Left bundle branch block [I44.7] Atrial fibrillation with rapid ventricular response (HCC) [I48.91] Atrial fibrillation with RVR (HCC) [I48.91] TSH elevation [R79.89] Congestive heart failure, unspecified HF chronicity, unspecified heart failure type [facility]) [I50.9] Patient Active Problem List   Diagnosis Date Noted   Atrial fibrillation with RVR (HCC) 04/16/2024   Acute CHF (congestive heart failure) (HCC) 04/16/2024   CAP (community acquired pneumonia) 04/16/2024   Dyslipidemia 04/16/2024   AKI (acute kidney injury) 04/16/2024   Uncontrolled type 2 diabetes mellitus with hyperglycemia, without long-term current use of insulin  (HCC) 04/16/2024   GERD without esophagitis 04/16/2024   COPD with acute exacerbation (HCC) 04/16/2024   PCP:  Delfina Pao, MD Pharmacy:   [facility] - [place], [place] - [address] [place] [address] Phone: [phone] Fax: [phone]     Social Drivers  of Health (SDOH) Social History: SDOH Screenings   Food Insecurity: No Food Insecurity (04/16/2024)  Housing: Low Risk (04/16/2024)  Transportation Needs: No Transportation Needs (04/16/2024)  Utilities: Not At Risk (04/16/2024)  Financial Resource Strain: Low Risk  (12/01/2023)   Received from [facility]  Social Connections: Patient Declined (04/16/2024)  Tobacco Use: Medium Risk (04/15/2024)   SDOH Interventions:     Readmission Risk Interventions     No data to display

## 2024-04-19 NOTE — Plan of Care (Signed)

## 2024-04-19 NOTE — Progress Notes (Signed)
 " PROGRESS NOTE    Amber Stevenson  FMW:[id number] DOB: 1955-09-08 DOA: 04/15/2024 PCP: Delfina Pao, MD  Chief Complaint  Patient presents with   Shortness of Breath    Hospital Course:  Amber Stevenson is a 69 y.o. female with medical history significant for depression, type II diabetes mellitus, hypertension, COPD, fibromyalgia, aortic insufficiency and hypothyroidism presented with DOE, hypoxia.  Was seen outpatient and was prescribed antibiotics and prednisone , felt better for couple days and symptoms continue to worsen.  Patient admitted for A-fib with RVR, acute heart failure, elevated troponin with new LBBB on EKG, pneumonia, COPD exacerbation.  Hospital course as below  1/14: increased metoprolol  for better HR control  Subjective:  Slowly improving but still SOB, grand daughter at bedside   Objective: Vitals:   04/19/24 0741 04/19/24 0749 04/19/24 1153 04/19/24 1658  BP: (!) 130/91  117/83 128/84  Pulse: (!) 109  97   Resp:   18 16  Temp: (!) 97.4 F (36.3 C)  (!) 97.5 F (36.4 C) (!) 97.4 F (36.3 C)  TempSrc: Axillary     SpO2: 99% 98% 96% 96%  Weight:      Height:        Intake/Output Summary (Last 24 hours) at 04/19/2024 1835 Last data filed at 04/19/2024 1424 Gross per 24 hour  Intake 735.62 ml  Output --  Net 735.62 ml   Filed Weights   04/17/24 0500 04/18/24 0500 04/19/24 0450  Weight: 85.3 kg 85 kg 85.2 kg    Examination: GENERAL:  69 y.o.-year-old female patient lying in the bed with no acute distress.  NECK:  Supple, no jugular venous distention. No thyroid  enlargement, no tenderness.  LUNGS: CTA b/l.  CARDIOVASCULAR: Tachycardic, irregular, S1, S2 normal. No murmurs, rubs, or gallops.  ABDOMEN: Soft, nondistended, nontender. Bowel sounds present. No organomegaly or mass.  EXTREMITIES: No pedal edema, cyanosis, or clubbing.  NEUROLOGIC: AO x 3, no gross focal deficits SKIN: No obvious rash, lesion, or ulcer  Assessment & Plan:  Atrial  fibrillation with RVR S/p IV digoxin  on admission, continue IV Amiodarone  and increase metoprolol  for better HR control On Eliquis  5 mg bid Cardiology following   Acute HFrEF, new diagnosis Echo shows EF 30-35%, global hypokinesis, moderate MR with echodensity of mitral valve chord, moderate to severe AR  Lasix  held. Continue Jardiance , metoprolol   Consider cardiac MRI once respiratory status improves - may be tomorrow/day after - Continue Crestor  40 mg daily.  Elevated troponin EKG with new LBBB Denies chest pain. EKG with new LBBB  CAP (community acquired pneumonia) Lactic acidosis Lactic acidosis, leukocytosis Flu/COVID/RSV negative Continue IV ceftriaxone , doxycycline  Follow-up blood cultures,  COPD with acute exacerbation IV Solu-Medrol  changed to po prednisone , Xopenex , ipratropium nebs due to tachycardia  AKI on CKD stage 3a/b Cr  2.19 -> 1.59, baseline Cr ~ 1.2 Hold diuresis US  renal negative for hydronephrosis  Nephrology following Monitor creatinine  Hyperkalemia - resolved   Uncontrolled type 2 diabetes mellitus with hyperglycemia, without long-term current use of insulin  Steroid Induced hyperglycemia HbA1c 8.0 Hold metformin, glipizide  SSI  Normocytic anemia Monitor Hb  HTN Hold amlodipine , resume as BP tolerates losartan  on hold due to AKI  Hypothyroidism TSH 5.15, FT4 0.93 Continue home levothyroxine   GERD without esophagitis PPI   Dyslipidemia Continue Crestor   Anxiety Bipolar disorder On Valium  as needed On carbamazepine  400 mg -> decreased to 200mg  daily, discussed with psychiatry. No hx of seizures Dose decreased due to interactions with Eliquis , did not want to be  on Coumadin  Obesity Class I Body mass index is 34.35 kg/m. Outpatient follow up for lifestyle modification and risk factor management  -- PT/OT rec SNF   DVT prophylaxis: Eliquis    Code Status: Full Code Disposition:  SNF  Consultants:  Treatment Team:   Consulting Physician: Florencio Cara BIRCH, MD Nephrology   Antimicrobials:  Anti-infectives (From admission, onward)    Start     Dose/Rate Route Frequency Ordered Stop   04/16/24 1200  cefTRIAXone  (ROCEPHIN ) 2 g in sodium chloride  0.9 % 100 mL IVPB        2 g 200 mL/hr over 30 Minutes Intravenous Every 24 hours 04/16/24 0418 04/21/24 1159   04/16/24 1000  doxycycline  (VIBRA -TABS) tablet 100 mg        100 mg Oral 2 times daily 04/16/24 0418 04/21/24 0959   04/16/24 0500  cefTRIAXone  (ROCEPHIN ) 2 g in sodium chloride  0.9 % 100 mL IVPB  Status:  Discontinued        2 g 200 mL/hr over 30 Minutes Intravenous Every 24 hours 04/16/24 0450 04/16/24 0454   04/16/24 0000  cefTRIAXone  (ROCEPHIN ) 1 g in sodium chloride  0.9 % 100 mL IVPB        1 g 200 mL/hr over 30 Minutes Intravenous  Once 04/15/24 2358 04/16/24 0042   04/16/24 0000  doxycycline  (VIBRAMYCIN ) 100 mg in sodium chloride  0.9 % 250 mL IVPB        100 mg 125 mL/hr over 120 Minutes Intravenous  Once 04/15/24 2358 04/16/24 0305       Data Reviewed: I have personally reviewed following labs and imaging studies CBC: Recent Labs  Lab 04/15/24 2220 04/16/24 0450 04/17/24 0451 04/18/24 0358 04/19/24 0402  WBC 14.6* 14.0* 17.8* 18.6* 12.6*  NEUTROABS 9.9*  --   --   --   --   HGB 11.1* 10.1* 10.5* 10.3* 10.6*  HCT 35.3* 32.2* 33.2* 32.5* 32.7*  MCV 92.2 92.0 90.0 90.8 89.1  PLT 350 321 321 377 286   Basic Metabolic Panel: Recent Labs  Lab 04/15/24 2220 04/16/24 0450 04/16/24 1013 04/17/24 0018 04/17/24 0451 04/17/24 1952 04/18/24 0358 04/19/24 0402  NA 132* 131*  --   --  132*  --  133* 135  K 5.3* 5.7*   < > 6.4* 5.4* 4.9 5.0 4.3  CL 97* 98  --   --  94*  --  94* 97*  CO2 19* 20*  --   --  21*  --  23 23  GLUCOSE 305* 364*  --   --  194*  --  104* 160*  BUN 47* 48*  --   --  67*  --  82* 70*  CREATININE 1.30* 1.21*  --   --  1.94*  --  2.19* 1.59*  CALCIUM  9.4 8.8*  --   --  8.9  --  8.5* 8.2*  MG 2.4  --   --    --  2.5*  --   --   --    < > = values in this interval not displayed.   GFR: Estimated Creatinine Clearance: 33.3 mL/min (A) (by C-G formula based on SCr of 1.59 mg/dL (H)). Liver Function Tests: Recent Labs  Lab 04/15/24 2220  AST 20  ALT 23  ALKPHOS 72  BILITOT 0.4  PROT 8.0  ALBUMIN 4.4   CBG: Recent Labs  Lab 04/18/24 1637 04/18/24 2143 04/19/24 0802 04/19/24 1154 04/19/24 1659  GLUCAP 212* 219* 224* 252* 234*  Recent Results (from the past 240 hours)  Resp panel by RT-PCR (RSV, Flu A&B, Covid) Anterior Nasal Swab     Status: None   Collection Time: 04/15/24 10:20 PM   Specimen: Anterior Nasal Swab  Result Value Ref Range Status   SARS Coronavirus 2 by RT PCR NEGATIVE NEGATIVE Final    Comment: (NOTE) SARS-CoV-2 target nucleic acids are NOT DETECTED.  The SARS-CoV-2 RNA is generally detectable in upper respiratory specimens during the acute phase of infection. The lowest concentration of SARS-CoV-2 viral copies this assay can detect is 138 copies/mL. A negative result does not preclude SARS-Cov-2 infection and should not be used as the sole basis for treatment or other patient management decisions. A negative result may occur with  improper specimen collection/handling, submission of specimen other than nasopharyngeal swab, presence of viral mutation(s) within the areas targeted by this assay, and inadequate number of viral copies(<138 copies/mL). A negative result must be combined with clinical observations, patient history, and epidemiological information. The expected result is Negative.  Fact Sheet for Patients:  bloggercourse.com  Fact Sheet for Healthcare Providers:  seriousbroker.it  This test is no t yet approved or cleared by the [place]  FDA and  has been authorized for detection and/or diagnosis of SARS-CoV-2 by FDA under an Emergency Use Authorization (EUA). This EUA will remain  in  effect (meaning this test can be used) for the duration of the COVID-19 declaration under Section 564(b)(1) of the Act, 21 U.S.C.section 360bbb-3(b)(1), unless the authorization is terminated  or revoked sooner.       Influenza A by PCR NEGATIVE NEGATIVE Final   Influenza B by PCR NEGATIVE NEGATIVE Final    Comment: (NOTE) The Xpert Xpress SARS-CoV-2/FLU/RSV plus assay is intended as an aid in the diagnosis of influenza from Nasopharyngeal swab specimens and should not be used as a sole basis for treatment. Nasal washings and aspirates are unacceptable for Xpert Xpress SARS-CoV-2/FLU/RSV testing.  Fact Sheet for Patients: bloggercourse.com  Fact Sheet for Healthcare Providers: seriousbroker.it  This test is not yet approved or cleared by the [place]  FDA and has been authorized for detection and/or diagnosis of SARS-CoV-2 by FDA under an Emergency Use Authorization (EUA). This EUA will remain in effect (meaning this test can be used) for the duration of the COVID-19 declaration under Section 564(b)(1) of the Act, 21 U.S.C. section 360bbb-3(b)(1), unless the authorization is terminated or revoked.     Resp Syncytial Virus by PCR NEGATIVE NEGATIVE Final    Comment: (NOTE) Fact Sheet for Patients: bloggercourse.com  Fact Sheet for Healthcare Providers: seriousbroker.it  This test is not yet approved or cleared by the [place]  FDA and has been authorized for detection and/or diagnosis of SARS-CoV-2 by FDA under an Emergency Use Authorization (EUA). This EUA will remain in effect (meaning this test can be used) for the duration of the COVID-19 declaration under Section 564(b)(1) of the Act, 21 U.S.C. section 360bbb-3(b)(1), unless the authorization is terminated or revoked.  Performed at [facility], [address] [place], [place] [address]   Blood  culture (routine x 2)     Status: None (Preliminary result)   Collection Time: 04/15/24 10:20 PM   Specimen: BLOOD  Result Value Ref Range Status   Specimen Description BLOOD BLOOD LEFT ARM  Final   Special Requests   Final    BOTTLES DRAWN AEROBIC AND ANAEROBIC Blood Culture adequate volume   Culture   Final    NO GROWTH 4 DAYS Performed  at [facility], [address] [place], [place] [address]    Report Status PENDING  Incomplete  Blood culture (routine x 2)     Status: None (Preliminary result)   Collection Time: 04/15/24 10:20 PM   Specimen: BLOOD  Result Value Ref Range Status   Specimen Description BLOOD BLOOD RIGHT FOREARM  Final   Special Requests   Final    BOTTLES DRAWN AEROBIC AND ANAEROBIC Blood Culture adequate volume   Culture   Final    NO GROWTH 4 DAYS Performed at [facility], [address] [place], [place] [address]    Report Status PENDING  Incomplete  Respiratory (~20 pathogens) panel by PCR     Status: None   Collection Time: 04/16/24  3:01 PM   Specimen: Nasopharyngeal Swab; Respiratory  Result Value Ref Range Status   Adenovirus NOT DETECTED NOT DETECTED Final   Coronavirus 229E NOT DETECTED NOT DETECTED Final    Comment: (NOTE) The Coronavirus on the Respiratory Panel, DOES NOT test for the novel  Coronavirus (2019 nCoV)    Coronavirus HKU1 NOT DETECTED NOT DETECTED Final   Coronavirus NL63 NOT DETECTED NOT DETECTED Final   Coronavirus OC43 NOT DETECTED NOT DETECTED Final   Metapneumovirus NOT DETECTED NOT DETECTED Final   Rhinovirus / Enterovirus NOT DETECTED NOT DETECTED Final   Influenza A NOT DETECTED NOT DETECTED Final   Influenza B NOT DETECTED NOT DETECTED Final   Parainfluenza Virus 1 NOT DETECTED NOT DETECTED Final   Parainfluenza Virus 2 NOT DETECTED NOT DETECTED Final   Parainfluenza Virus 3 NOT DETECTED NOT DETECTED Final   Parainfluenza Virus 4 NOT DETECTED NOT DETECTED Final   Respiratory Syncytial Virus NOT  DETECTED NOT DETECTED Final   Bordetella pertussis NOT DETECTED NOT DETECTED Final   Bordetella Parapertussis NOT DETECTED NOT DETECTED Final   Chlamydophila pneumoniae NOT DETECTED NOT DETECTED Final   Mycoplasma pneumoniae NOT DETECTED NOT DETECTED Final    Comment: Performed at [facility], [address] [place], [place] [address]     Radiology Studies: No results found.   Scheduled Meds:  apixaban   5 mg Oral BID   budesonide  (PULMICORT ) nebulizer solution  0.25 mg Nebulization BID   carbamazepine   200 mg Oral Daily   cholecalciferol   2,000 Units Oral Daily   doxycycline   100 mg Oral BID   DULoxetine   60 mg Oral Daily   empagliflozin   25 mg Oral Daily   guaiFENesin   600 mg Oral BID   insulin  aspart  0-15 Units Subcutaneous TID WC   insulin  aspart  0-5 Units Subcutaneous QHS   levothyroxine   112 mcg Oral Q0600   metoprolol  succinate  50 mg Oral TID   pantoprazole   40 mg Oral Daily   predniSONE   40 mg Oral Q breakfast   rosuvastatin   40 mg Oral Daily   saccharomyces boulardii  250 mg Oral BID   Continuous Infusions:  amiodarone  30 mg/hr (04/19/24 1257)   cefTRIAXone  (ROCEPHIN )  IV 2 g (04/19/24 1143)      LOS: 3 days  MDM: Patient is high risk for one or more organ failure.  They necessitate ongoing hospitalization for continued IV therapies and subsequent lab monitoring. Total time spent interpreting labs and vitals, reviewing the medical record, coordinating care amongst consultants and care team members, directly assessing and discussing care with the patient and/or family: 35 min Aileene Lanum Maree, MD [facility]  To contact the attending physician between 7A-7P please use Epic  Chat. To contact the covering physician during after hours 7P-7A, please review Amion.  04/19/2024, 6:35 PM   *This document has been created with the assistance of dictation software. Please excuse typographical errors. *   "

## 2024-04-19 NOTE — Progress Notes (Signed)
 " [facility]  ROUNDING NOTE   Subjective:   Patient seen resting in bed Multiple family members at bedside Weaned to room air  Creatinine 1.59  Objective:  Vital signs in last 24 hours:  Temp:  [97.3 F (36.3 C)-98.2 F (36.8 C)] 97.5 F (36.4 C) (01/14 1153) Pulse Rate:  [92-109] 97 (01/14 1153) Resp:  [17-18] 18 (01/14 1153) BP: (117-130)/(45-91) 117/83 (01/14 1153) SpO2:  [93 %-100 %] 96 % (01/14 1153) Weight:  [85.2 kg] 85.2 kg (01/14 0450)  Weight change: 0.2 kg Filed Weights   04/17/24 0500 04/18/24 0500 04/19/24 0450  Weight: 85.3 kg 85 kg 85.2 kg    Intake/Output: I/O last 3 completed shifts: In: 1568.3 [P.O.:580; I.V.:988.3] Out: -    Intake/Output this shift:  Total I/O In: 120 [P.O.:120] Out: -   Physical Exam: General: NAD  Head: Normocephalic, atraumatic. Moist oral mucosal membranes  Eyes: Anicteric  Lungs:  Clear to auscultation, normal effort  Heart: Irregular rate and rhythm  Abdomen:  Soft, nontender  Extremities:  No peripheral edema.  Neurologic: Awake, alert, conversant  Skin: Warm,dry, no rash       Basic Metabolic Panel: Recent Labs  Lab 04/15/24 2220 04/16/24 0450 04/16/24 1013 04/17/24 0018 04/17/24 0451 04/17/24 1952 04/18/24 0358 04/19/24 0402  NA 132* 131*  --   --  132*  --  133* 135  K 5.3* 5.7*   < > 6.4* 5.4* 4.9 5.0 4.3  CL 97* 98  --   --  94*  --  94* 97*  CO2 19* 20*  --   --  21*  --  23 23  GLUCOSE 305* 364*  --   --  194*  --  104* 160*  BUN 47* 48*  --   --  67*  --  82* 70*  CREATININE 1.30* 1.21*  --   --  1.94*  --  2.19* 1.59*  CALCIUM  9.4 8.8*  --   --  8.9  --  8.5* 8.2*  MG 2.4  --   --   --  2.5*  --   --   --    < > = values in this interval not displayed.    Liver Function Tests: Recent Labs  Lab 04/15/24 2220  AST 20  ALT 23  ALKPHOS 72  BILITOT 0.4  PROT 8.0  ALBUMIN 4.4   No results for input(s): LIPASE, AMYLASE in the last 168 hours. No results for input(s):  AMMONIA in the last 168 hours.  CBC: Recent Labs  Lab 04/15/24 2220 04/16/24 0450 04/17/24 0451 04/18/24 0358 04/19/24 0402  WBC 14.6* 14.0* 17.8* 18.6* 12.6*  NEUTROABS 9.9*  --   --   --   --   HGB 11.1* 10.1* 10.5* 10.3* 10.6*  HCT 35.3* 32.2* 33.2* 32.5* 32.7*  MCV 92.2 92.0 90.0 90.8 89.1  PLT 350 321 321 377 286    Cardiac Enzymes: No results for input(s): CKTOTAL, CKMB, CKMBINDEX, TROPONINI in the last 168 hours.  BNP: Invalid input(s): POCBNP  CBG: Recent Labs  Lab 04/18/24 1206 04/18/24 1637 04/18/24 2143 04/19/24 0802 04/19/24 1154  GLUCAP 167* 212* 219* 224* 252*    Microbiology: Results for orders placed or performed during the hospital encounter of 04/15/24  Resp panel by RT-PCR (RSV, Flu A&B, Covid) Anterior Nasal Swab     Status: None   Collection Time: 04/15/24 10:20 PM   Specimen: Anterior Nasal Swab  Result Value Ref Range Status  SARS Coronavirus 2 by RT PCR NEGATIVE NEGATIVE Final    Comment: (NOTE) SARS-CoV-2 target nucleic acids are NOT DETECTED.  The SARS-CoV-2 RNA is generally detectable in upper respiratory specimens during the acute phase of infection. The lowest concentration of SARS-CoV-2 viral copies this assay can detect is 138 copies/mL. A negative result does not preclude SARS-Cov-2 infection and should not be used as the sole basis for treatment or other patient management decisions. A negative result may occur with  improper specimen collection/handling, submission of specimen other than nasopharyngeal swab, presence of viral mutation(s) within the areas targeted by this assay, and inadequate number of viral copies(<138 copies/mL). A negative result must be combined with clinical observations, patient history, and epidemiological information. The expected result is Negative.  Fact Sheet for Patients:  bloggercourse.com  Fact Sheet for Healthcare Providers:   seriousbroker.it  This test is no t yet approved or cleared by the [place]  FDA and  has been authorized for detection and/or diagnosis of SARS-CoV-2 by FDA under an Emergency Use Authorization (EUA). This EUA will remain  in effect (meaning this test can be used) for the duration of the COVID-19 declaration under Section 564(b)(1) of the Act, 21 U.S.C.section 360bbb-3(b)(1), unless the authorization is terminated  or revoked sooner.       Influenza A by PCR NEGATIVE NEGATIVE Final   Influenza B by PCR NEGATIVE NEGATIVE Final    Comment: (NOTE) The Xpert Xpress SARS-CoV-2/FLU/RSV plus assay is intended as an aid in the diagnosis of influenza from Nasopharyngeal swab specimens and should not be used as a sole basis for treatment. Nasal washings and aspirates are unacceptable for Xpert Xpress SARS-CoV-2/FLU/RSV testing.  Fact Sheet for Patients: bloggercourse.com  Fact Sheet for Healthcare Providers: seriousbroker.it  This test is not yet approved or cleared by the [place]  FDA and has been authorized for detection and/or diagnosis of SARS-CoV-2 by FDA under an Emergency Use Authorization (EUA). This EUA will remain in effect (meaning this test can be used) for the duration of the COVID-19 declaration under Section 564(b)(1) of the Act, 21 U.S.C. section 360bbb-3(b)(1), unless the authorization is terminated or revoked.     Resp Syncytial Virus by PCR NEGATIVE NEGATIVE Final    Comment: (NOTE) Fact Sheet for Patients: bloggercourse.com  Fact Sheet for Healthcare Providers: seriousbroker.it  This test is not yet approved or cleared by the [place]  FDA and has been authorized for detection and/or diagnosis of SARS-CoV-2 by FDA under an Emergency Use Authorization (EUA). This EUA will remain in effect (meaning this test can be used) for  the duration of the COVID-19 declaration under Section 564(b)(1) of the Act, 21 U.S.C. section 360bbb-3(b)(1), unless the authorization is terminated or revoked.  Performed at [facility], [address] [place], [place] [address]   Blood culture (routine x 2)     Status: None (Preliminary result)   Collection Time: 04/15/24 10:20 PM   Specimen: BLOOD  Result Value Ref Range Status   Specimen Description BLOOD BLOOD LEFT ARM  Final   Special Requests   Final    BOTTLES DRAWN AEROBIC AND ANAEROBIC Blood Culture adequate volume   Culture   Final    NO GROWTH 4 DAYS Performed at [facility], [address] [place], [place] [address]    Report Status PENDING  Incomplete  Blood culture (routine x 2)     Status: None (Preliminary result)   Collection Time: 04/15/24 10:20 PM   Specimen: BLOOD  Result  Value Ref Range Status   Specimen Description BLOOD BLOOD RIGHT FOREARM  Final   Special Requests   Final    BOTTLES DRAWN AEROBIC AND ANAEROBIC Blood Culture adequate volume   Culture   Final    NO GROWTH 4 DAYS Performed at [facility], [address] [place], [place] [address]    Report Status PENDING  Incomplete  Respiratory (~20 pathogens) panel by PCR     Status: None   Collection Time: 04/16/24  3:01 PM   Specimen: Nasopharyngeal Swab; Respiratory  Result Value Ref Range Status   Adenovirus NOT DETECTED NOT DETECTED Final   Coronavirus 229E NOT DETECTED NOT DETECTED Final    Comment: (NOTE) The Coronavirus on the Respiratory Panel, DOES NOT test for the novel  Coronavirus (2019 nCoV)    Coronavirus HKU1 NOT DETECTED NOT DETECTED Final   Coronavirus NL63 NOT DETECTED NOT DETECTED Final   Coronavirus OC43 NOT DETECTED NOT DETECTED Final   Metapneumovirus NOT DETECTED NOT DETECTED Final   Rhinovirus / Enterovirus NOT DETECTED NOT DETECTED Final   Influenza A NOT DETECTED NOT DETECTED Final   Influenza B NOT DETECTED NOT DETECTED Final    Parainfluenza Virus 1 NOT DETECTED NOT DETECTED Final   Parainfluenza Virus 2 NOT DETECTED NOT DETECTED Final   Parainfluenza Virus 3 NOT DETECTED NOT DETECTED Final   Parainfluenza Virus 4 NOT DETECTED NOT DETECTED Final   Respiratory Syncytial Virus NOT DETECTED NOT DETECTED Final   Bordetella pertussis NOT DETECTED NOT DETECTED Final   Bordetella Parapertussis NOT DETECTED NOT DETECTED Final   Chlamydophila pneumoniae NOT DETECTED NOT DETECTED Final   Mycoplasma pneumoniae NOT DETECTED NOT DETECTED Final    Comment: Performed at [facility], [address], [place], [place] [address]    Coagulation Studies: No results for input(s): LABPROT, INR in the last 72 hours.   Urinalysis: No results for input(s): COLORURINE, LABSPEC, PHURINE, GLUCOSEU, HGBUR, BILIRUBINUR, KETONESUR, PROTEINUR, UROBILINOGEN, NITRITE, LEUKOCYTESUR in the last 72 hours.  Invalid input(s): APPERANCEUR    Imaging: No results found.    Medications:    amiodarone  30 mg/hr (04/19/24 1257)   cefTRIAXone  (ROCEPHIN )  IV 2 g (04/19/24 1143)    apixaban   5 mg Oral BID   budesonide  (PULMICORT ) nebulizer solution  0.25 mg Nebulization BID   carbamazepine   200 mg Oral Daily   cholecalciferol   2,000 Units Oral Daily   doxycycline   100 mg Oral BID   DULoxetine   60 mg Oral Daily   empagliflozin   25 mg Oral Daily   guaiFENesin   600 mg Oral BID   insulin  aspart  0-15 Units Subcutaneous TID WC   insulin  aspart  0-5 Units Subcutaneous QHS   levothyroxine   112 mcg Oral Q0600   metoprolol  succinate  50 mg Oral TID   pantoprazole   40 mg Oral Daily   predniSONE   40 mg Oral Q breakfast   rosuvastatin   40 mg Oral Daily   saccharomyces boulardii  250 mg Oral BID   acetaminophen  **OR** acetaminophen , chlorpheniramine-HYDROcodone , diazepam , HYDROcodone -acetaminophen , ipratropium, levalbuterol , magnesium  hydroxide, menthol , methocarbamol , ondansetron  **OR** ondansetron  (ZOFRAN ) IV, mouth  rinse, traZODone   Assessment/ Plan:  [name] is a 69 y.o.  female with past medical conditions including hypertension, diabetes type 2, fibromyalgia, COPD, aortic insufficiency, and hypothyroidism, chronic kidney disease stage II, who was admitted to [facility] on 04/15/2024 for Bronchospasm [J98.01] Left bundle branch block [I44.7] Atrial fibrillation with rapid ventricular response (HCC) [I48.91] Atrial fibrillation with RVR (HCC) [I48.91]  TSH elevation [R79.89] Congestive heart failure, unspecified HF chronicity, unspecified heart failure type (HCC) [I50.9]   Acute kidney injury on chronic kidney disease stage II. Baseline creatinine 1.0 with GFR 61 in August. Acute kidney injury likely multifactorial, IV diuresis and dysrhythmia. Will order renal US  for obstruction. Will hold IV diuresis for now.  Creatinine has shown improvement.  Diuretics remain held.  Will continue to monitor and encourage oral intake.  No acute indication for dialysis.  Lab Results  Component Value Date   CREATININE 1.59 (H) 04/19/2024   CREATININE 2.19 (H) 04/18/2024   CREATININE 1.94 (H) 04/17/2024    Intake/Output Summary (Last 24 hours) at 04/19/2024 1449 Last data filed at 04/19/2024 1424 Gross per 24 hour  Intake 735.62 ml  Output --  Net 735.62 ml   2. Hyperkalemia/hyponatremia.  Sodium and potassium within desired..  Continue to monitor for now.  3. Anemia of chronic kidney disease  Lab Results  Component Value Date   HGB 10.6 (L) 04/19/2024    Hgb  within desired range.  4. Acute systolic CHF, ECHO from 1/11 shows EF 30-35 % with global hypokinesis. Questionable endocarditis, recommend TEE for evaluation. Furosemide  held   5. Secondary Hyperparathyroidism: with outpatient labs: PTH 57, phosphorus 3.5, calcium  9.0 on 12/01/23.   Lab Results  Component Value Date   CALCIUM  8.2 (L) 04/19/2024   Will continue to remove bone minerals daily    LOS: 3 Elpidia Karn 1/14/20262:49  PM   "

## 2024-04-19 NOTE — Progress Notes (Signed)
 Occupational Therapy Treatment Patient Details Name: Amber Stevenson MRN: [id number] DOB: 06-04-1955 Today's Date: 04/19/2024   History of present illness Pt is a 69 y.o. female with depression, type II diabetes mellitus, hypertension, COPD, fibromyalgia, aortic insufficiency and hypothyroidism  who presented with worsening dyspnea. Chest x-ray noted findings suggesting congestive heart failure.   OT comments  Chart reviewed to date, pt greeted semi supine in bed, eager to mobilize. She is in agreement to OT tx session targeting improving functional activity tolerance in prep for ADL tasks. Pt is making progress towards goals, bed mobility completed with supervision, STS with CGA, amb approx 75' with RW with CGA. LB dressing completed with supervision. Education provided to pt/son/grand daughter re use of AE for safer ADL completion. Family reports pt will have 24/7 assist. Pt is left as received, all needs met OT will continue to follow acutely to facilitate optimal ADL/functional mobility engagement.       If plan is discharge home, recommend the following:  A little help with walking and/or transfers;A little help with bathing/dressing/bathroom;Help with stairs or ramp for entrance;Assistance with cooking/housework   Equipment Recommendations  BSC/3in1;Tub/shower bench;Other (comment) (2WW)    Recommendations for Other Services      Precautions / Restrictions Precautions Precautions: Fall Recall of Precautions/Restrictions: Impaired Restrictions Weight Bearing Restrictions Per Provider Order: No       Mobility Bed Mobility Overal bed mobility: Needs Assistance Bed Mobility: Supine to Sit, Sit to Supine     Supine to sit: Supervision, Used rails, HOB elevated Sit to supine: Supervision        Transfers Overall transfer level: Needs assistance Equipment used: Rolling walker (2 wheels)   Sit to Stand: Contact guard assist                 Balance   Sitting-balance  support: Feet supported Sitting balance-Leahy Scale: Good     Standing balance support: Single extremity supported, During functional activity, Reliant on assistive device for balance Standing balance-Leahy Scale: Fair                             ADL either performed or assessed with clinical judgement   ADL Overall ADL's : Needs assistance/impaired                     Lower Body Dressing: Supervision/safety;Sitting/lateral leans Lower Body Dressing Details (indicate cue type and reason): donn/doff slippers Toilet Transfer: Contact guard assist;Rolling walker (2 wheels) Toilet Transfer Details (indicate cue type and reason): simulated         Functional mobility during ADLs: Supervision/safety;Contact guard assist;Rolling walker (2 wheels) (approx 75')      Extremity/Trunk Assessment              Vision       Perception     Praxis     Communication Communication Communication: No apparent difficulties   Cognition Arousal: Alert Behavior During Therapy: WFL for tasks assessed/performed, Restless Cognition: Cognition impaired           Executive functioning impairment (select all impairments): Problem solving OT - Cognition Comments: will continue to assess                 Following commands: Intact        Cueing   Cueing Techniques: Verbal cues, Tactile cues  Exercises Other Exercises Other Exercises: edu pt/son/grand daugther re role of OT, role of rehab, discharge recommendations  Shoulder Instructions       General Comments HR to 120s bpm with mobility, spo2 >90% on RA    Pertinent Vitals/ Pain       Pain Assessment Pain Assessment: No/denies pain  Home Living                                          Prior Functioning/Environment              Frequency  Min 2X/week        Progress Toward Goals  OT Goals(current goals can now be found in the care plan section)  Progress towards  OT goals: Progressing toward goals  Acute Rehab OT Goals Time For Goal Achievement: 05/01/24  Plan      Co-evaluation                 AM-PAC OT 6 Clicks Daily Activity     Outcome Measure   Help from another person eating meals?: None Help from another person taking care of personal grooming?: None Help from another person toileting, which includes using toliet, bedpan, or urinal?: A Little Help from another person bathing (including washing, rinsing, drying)?: A Little Help from another person to put on and taking off regular upper body clothing?: None Help from another person to put on and taking off regular lower body clothing?: A Little 6 Click Score: 21    End of Session    OT Visit Diagnosis: Other abnormalities of gait and mobility (R26.89);Muscle weakness (generalized) (M62.81)   Activity Tolerance Patient tolerated treatment well   Patient Left in bed;with call bell/phone within reach;with bed alarm set;with family/visitor present   Nurse Communication Mobility status        Time: 8546-8493 OT Time Calculation (min): 13 min  Charges: OT General Charges $OT Visit: 1 Visit OT Treatments $Therapeutic Activity: 8-22 mins  Therisa Sheffield, OTD OTR/L  04/19/2024, 3:24 PM

## 2024-04-19 NOTE — Progress Notes (Signed)
 [facility] CLINIC CARDIOLOGY PROGRESS NOTE   Patient ID: Amber Stevenson MRN: [id number] DOB/AGE: 71-Jul-1957 44 y.o.  Admit date: 04/15/2024 Referring Physician Dr. Madison Peaches Primary Physician Delfina Pao, MD  Primary Cardiologist Dr. Ammon (last seen 2022) Reason for Consultation wide-complex tachycardia, acute heart failure  HPI: Amber Stevenson is a 69 y.o. female with a past medical history of hypertension, COPD, type 2 diabetes, depression who presented to the ED on 04/15/2024 for worsening shortness of breath.  Initial EKG with wide-complex tachycardia, A-fib with aberrancy.  Cardiology was consulted for further evaluation.   Interval History:  - Patient seen and examined this morning, resting comfortably in hospital bed with family at bedside. - Reports improvement in SOB. HR and BP relatively stable.  - Creatinine improved today, nephro following.  Review of systems complete and found to be negative unless listed above   Vitals:   04/19/24 0413 04/19/24 0450 04/19/24 0741 04/19/24 0749  BP: (!) 118/45  (!) 130/91   Pulse: 97  (!) 109   Resp: 18     Temp: 98.1 F (36.7 C)  (!) 97.4 F (36.3 C)   TempSrc:   Axillary   SpO2: 100%  99% 98%  Weight:  85.2 kg    Height:         Intake/Output Summary (Last 24 hours) at 04/19/2024 9071 Last data filed at 04/19/2024 0645 Gross per 24 hour  Intake 615.62 ml  Output --  Net 615.62 ml     PHYSICAL EXAM General: Chronically ill appearing female, well nourished, in no acute distress. HEENT: Normocephalic and atraumatic. Neck: No JVD.  Lungs: Normal respiratory effort on 2L [place]. Clear bilaterally to auscultation. No wheezes, crackles, rhonchi.  Heart: Irregularly irregular, controlled rate. Normal S1 and S2 without gallops or murmurs. Radial & DP pulses 2+ bilaterally. Abdomen: Non-distended appearing.  Msk: Normal strength and tone for age. Extremities: No clubbing, cyanosis or edema.   Neuro: Alert and oriented X  3. Psych: Mood appropriate, affect congruent.    LABS: Basic Metabolic Panel: Recent Labs    04/17/24 0451 04/17/24 1952 04/18/24 0358 04/19/24 0402  NA 132*  --  133* 135  K 5.4*   < > 5.0 4.3  CL 94*  --  94* 97*  CO2 21*  --  23 23  GLUCOSE 194*  --  104* 160*  BUN 67*  --  82* 70*  CREATININE 1.94*  --  2.19* 1.59*  CALCIUM  8.9  --  8.5* 8.2*  MG 2.5*  --   --   --    < > = values in this interval not displayed.   Liver Function Tests: No results for input(s): AST, ALT, ALKPHOS, BILITOT, PROT, ALBUMIN in the last 72 hours.  No results for input(s): LIPASE, AMYLASE in the last 72 hours. CBC: Recent Labs    04/18/24 0358 04/19/24 0402  WBC 18.6* 12.6*  HGB 10.3* 10.6*  HCT 32.5* 32.7*  MCV 90.8 89.1  PLT 377 286   Cardiac Enzymes: No results for input(s): CKTOTAL, CKMB, CKMBINDEX, TROPONINIHS in the last 72 hours. BNP: No results for input(s): BNP in the last 72 hours. D-Dimer: No results for input(s): DDIMER in the last 72 hours. Hemoglobin A1C: No results for input(s): HGBA1C in the last 72 hours.  Fasting Lipid Panel: No results for input(s): CHOL, HDL, LDLCALC, TRIG, CHOLHDL, LDLDIRECT in the last 72 hours. Thyroid  Function Tests: No results for input(s): TSH, T4TOTAL, T3FREE, THYROIDAB in the last 72 hours.  Invalid input(s): FREET3  Anemia Panel: No results for input(s): VITAMINB12, FOLATE, FERRITIN, TIBC, IRON, RETICCTPCT in the last 72 hours.  US  RENAL Result Date: 04/17/2024 EXAM: RETROPERITONEAL ULTRASOUND OF THE KIDNEYS 04/17/2024 12:02:58 PM TECHNIQUE: Real-time ultrasonography of the retroperitoneum, specifically the kidneys and urinary bladder, was performed. COMPARISON: None available. CLINICAL HISTORY: Acute kidney failure. FINDINGS: RIGHT KIDNEY: Right kidney measures 10.4 x 4.4 x 4.7 cm. Mild cortical thinning is noted. Calculated volume is 113 ml. No hydronephrosis. No mass.  LEFT KIDNEY: Left kidney measures 9.7 x 5.8 x 4.4 cm. Calculated volume is 128 ml. Mild cortical thinning is noted. No hydronephrosis. No mass. BLADDER: Decompressed INCIDENTAL FINDINGS: Small bilateral pleural effusions. IMPRESSION: 1. Mild cortical thinning in both kidneys. 2. Small bilateral pleural effusions. Electronically signed by: Oneil Devonshire MD MD 04/17/2024 11:43 PM EST RP Workstation: [id number]     ECHO as above  TELEMETRY (personally reviewed): atrial fibrillation LBBB rate 100-110s  EKG (personally reviewed): Atrial fibrillation RVR LBBB rate 150 bpm  DATA reviewed by me 04/19/2024: last 24h vitals tele labs imaging I/O, hospitalist progress note  Principal Problem:   Atrial fibrillation with RVR (HCC) Active Problems:   Acute CHF (congestive heart failure) (HCC)   CAP (community acquired pneumonia)   Dyslipidemia   AKI (acute kidney injury)   Uncontrolled type 2 diabetes mellitus with hyperglycemia, without long-term current use of insulin  (HCC)   GERD without esophagitis   COPD with acute exacerbation (HCC)    ASSESSMENT AND PLAN: Amber Stevenson is a 69 y.o. female with a past medical history of hypertension, COPD, type 2 diabetes, depression who presented to the ED on 04/15/2024 for worsening shortness of breath.  Initial EKG with wide-complex tachycardia, A-fib with aberrancy.  Cardiology was consulted for further evaluation.   # Atrial fibrillation RVR with abberancy # Acute HFrEF # Community acquired pneumonia # Acute COPD exacerbation # AKI on CKD Patient presented with worsening shortness of breath, also found to be in wide-complex tachycardia on EKG which appears to be atrial fibrillation with aberrancy.  Rate overall improved with IV amiodarone  and digoxin .  Echo this admission with a EF 30-35%, global hypokinesis, moderate MR with echodensity of mitral valve chord, moderate to severe AR. - Continue IV amiodarone  infusion. Increase metoprolol  succinate to 50 mg  three times daily as BP allows for rate control.  - Will hold off on further digoxin  dosing due to elevated dig level.  - Continue Eliquis  5 mg twice daily.  This plan was discussed with pharmacy as she is currently on carbamazepine  which has a DDI.  Primary team discussed with psych who advised it was ok to decrease carbamazepine  dose.  - Holding off on further IV diuresis.  Nephrology has been consulted, appreciate their evaluation. - Continue Jardiance  25 mg daily. - Continue Crestor  40 mg daily. - Will consider cardiac MRI this admission when respiratory status improves and she is able to tolerate laying flat for an extended period.  This patient's case was discussed and created with Dr. Florencio and he is in agreement.  Signed:  Danita Bloch, PA-C  04/19/2024, 9:28 AM [facility]

## 2024-04-19 NOTE — Plan of Care (Signed)
  Problem: Education: Goal: Knowledge of General Education information will improve Description: Including pain rating scale, medication(s)/side effects and non-pharmacologic comfort measures Outcome: Progressing   Problem: Activity: Goal: Risk for activity intolerance will decrease Outcome: Progressing   Problem: Nutrition: Goal: Adequate nutrition will be maintained Outcome: Progressing   Problem: Coping: Goal: Level of anxiety will decrease Outcome: Progressing   Problem: Elimination: Goal: Will not experience complications related to urinary retention Outcome: Progressing   Problem: Pain Managment: Goal: General experience of comfort will improve and/or be controlled Outcome: Progressing   Problem: Safety: Goal: Ability to remain free from injury will improve Outcome: Progressing   Problem: Skin Integrity: Goal: Risk for impaired skin integrity will decrease Outcome: Progressing

## 2024-04-19 NOTE — Inpatient Diabetes Management (Signed)
 Inpatient Diabetes Program Recommendations  AACE/ADA: New Consensus Statement on Inpatient Glycemic Control (2015)  Target Ranges:  Prepandial:   less than 140 mg/dL      Peak postprandial:   less than 180 mg/dL (1-2 hours)      Critically ill patients:  140 - 180 mg/dL   Lab Results  Component Value Date   GLUCAP 252 (H) 04/19/2024   HGBA1C 8.0 (H) 04/16/2024    Latest Reference Range & Units 04/18/24 08:01 04/18/24 12:06 04/18/24 16:37 04/18/24 21:43 04/19/24 08:02 04/19/24 11:54  Glucose-Capillary 70 - 99 mg/dL 894 (H) 832 (H) 787 (H) 219 (H) 224 (H) 252 (H)  (H): Data is abnormally high   Diabetes history: DM2 Outpatient Diabetes medications:  Jardiance  25 mg daily, Glucotrol  5 mg daily, Metformin 1 gm bid  Current orders for Inpatient glycemic control: Jardiance  25 mg daily Novolog  0-15 units tid, 0-5 units hs correction Predinisone 40 mg daily  Inpatient Diabetes Program Recommendations:   Please consider: -Add Novolog  2 units tid ac meals if Postprandial CBGs > 180.  Thank you, Haileyann Staiger E. Renetta Suman, RN, MSN, CNS, CDCES  Diabetes Coordinator Inpatient Glycemic Control Team Team Pager [phone] (8am-5pm) 04/19/2024 12:05 PM

## 2024-04-20 DIAGNOSIS — I4891 Unspecified atrial fibrillation: Secondary | ICD-10-CM | POA: Diagnosis not present

## 2024-04-20 DIAGNOSIS — E1165 Type 2 diabetes mellitus with hyperglycemia: Secondary | ICD-10-CM | POA: Diagnosis not present

## 2024-04-20 DIAGNOSIS — N179 Acute kidney failure, unspecified: Secondary | ICD-10-CM | POA: Diagnosis not present

## 2024-04-20 DIAGNOSIS — I509 Heart failure, unspecified: Secondary | ICD-10-CM | POA: Diagnosis not present

## 2024-04-20 LAB — GLUCOSE, CAPILLARY
Glucose-Capillary: 260 mg/dL — ABNORMAL HIGH (ref 70–99)
Glucose-Capillary: 183 mg/dL — ABNORMAL HIGH (ref 70–99)
Glucose-Capillary: 287 mg/dL — ABNORMAL HIGH (ref 70–99)
Glucose-Capillary: 242 mg/dL — ABNORMAL HIGH (ref 70–99)

## 2024-04-20 LAB — CBC
HCT: 33.7 % — ABNORMAL LOW (ref 36.0–46.0)
Hemoglobin: 10.6 g/dL — ABNORMAL LOW (ref 12.0–15.0)
MCH: 28.8 pg (ref 26.0–34.0)
MCHC: 31.5 g/dL (ref 30.0–36.0)
MCV: 91.6 fL (ref 80.0–100.0)
Platelets: 266 K/uL (ref 150–400)
RBC: 3.68 MIL/uL — ABNORMAL LOW (ref 3.87–5.11)
RDW: 15.3 % (ref 11.5–15.5)
WBC: 10.9 K/uL — ABNORMAL HIGH (ref 4.0–10.5)
nRBC: 0 % (ref 0.0–0.2)

## 2024-04-20 LAB — BASIC METABOLIC PANEL WITH GFR
Anion gap: 11 (ref 5–15)
BUN: 50 mg/dL — ABNORMAL HIGH (ref 8–23)
CO2: 26 mmol/L (ref 22–32)
Calcium: 8.8 mg/dL — ABNORMAL LOW (ref 8.9–10.3)
Chloride: 99 mmol/L (ref 98–111)
Creatinine, Ser: 1.4 mg/dL — ABNORMAL HIGH (ref 0.44–1.00)
GFR, Estimated: 40 mL/min — ABNORMAL LOW
Glucose, Bld: 112 mg/dL — ABNORMAL HIGH (ref 70–99)
Potassium: 4.6 mmol/L (ref 3.5–5.1)
Sodium: 137 mmol/L (ref 135–145)

## 2024-04-20 LAB — CULTURE, BLOOD (ROUTINE X 2)
Culture: NO GROWTH
Culture: NO GROWTH
Special Requests: ADEQUATE
Special Requests: ADEQUATE

## 2024-04-20 MED ORDER — METOPROLOL SUCCINATE ER 50 MG PO TB24
75.0000 mg | ORAL_TABLET | Freq: Two times a day (BID) | ORAL | Status: DC
  Administered 2024-04-20 – 2024-04-21 (×3): 75 mg via ORAL
  Filled 2024-04-20 (×3): qty 1

## 2024-04-20 MED ORDER — AMIODARONE HCL 200 MG PO TABS: 200.0000 mg | ORAL_TABLET | Freq: Every day | ORAL | Status: DC

## 2024-04-20 MED ORDER — DICLOFENAC SODIUM 1 % EX GEL
4.0000 g | Freq: Four times a day (QID) | CUTANEOUS | Status: DC
  Administered 2024-04-20 – 2024-04-21 (×3): 4 g via TOPICAL
  Filled 2024-04-20: qty 100

## 2024-04-20 MED ORDER — AMIODARONE HCL 200 MG PO TABS
400.0000 mg | ORAL_TABLET | Freq: Two times a day (BID) | ORAL | Status: DC
  Administered 2024-04-20 – 2024-04-21 (×3): 400 mg via ORAL
  Filled 2024-04-20 (×3): qty 2

## 2024-04-20 NOTE — Plan of Care (Signed)

## 2024-04-20 NOTE — Plan of Care (Signed)
 " Problem: Education: Goal: Ability to describe self-care measures that may prevent or decrease complications (Diabetes Survival Skills Education) will improve 04/20/2024 1921 by Gwenn Prentice BIRCH, RN Outcome: Progressing 04/20/2024 1920 by Gwenn Prentice BIRCH, RN Outcome: Progressing Goal: Individualized Educational Video(s) 04/20/2024 1921 by Gwenn Prentice BIRCH, RN Outcome: Progressing 04/20/2024 1920 by Gwenn Prentice BIRCH, RN Outcome: Progressing   Problem: Coping: Goal: Ability to adjust to condition or change in health will improve 04/20/2024 1921 by Gwenn Prentice BIRCH, RN Outcome: Progressing 04/20/2024 1920 by Gwenn Prentice BIRCH, RN Outcome: Progressing   Problem: Fluid Volume: Goal: Ability to maintain a balanced intake and output will improve 04/20/2024 1921 by Gwenn Prentice BIRCH, RN Outcome: Progressing 04/20/2024 1920 by Gwenn Prentice BIRCH, RN Outcome: Progressing   Problem: Health Behavior/Discharge Planning: Goal: Ability to identify and utilize available resources and services will improve 04/20/2024 1921 by Gwenn Prentice BIRCH, RN Outcome: Progressing 04/20/2024 1920 by Gwenn Prentice BIRCH, RN Outcome: Progressing Goal: Ability to manage health-related needs will improve 04/20/2024 1921 by Gwenn Prentice BIRCH, RN Outcome: Progressing 04/20/2024 1920 by Gwenn Prentice BIRCH, RN Outcome: Progressing   Problem: Metabolic: Goal: Ability to maintain appropriate glucose levels will improve 04/20/2024 1921 by Gwenn Prentice BIRCH, RN Outcome: Progressing 04/20/2024 1920 by Gwenn Prentice BIRCH, RN Outcome: Progressing   Problem: Nutritional: Goal: Maintenance of adequate nutrition will improve 04/20/2024 1921 by Gwenn Prentice BIRCH, RN Outcome: Progressing 04/20/2024 1920 by Gwenn Prentice BIRCH, RN Outcome: Progressing Goal: Progress toward achieving an optimal weight will improve 04/20/2024 1921 by Gwenn Prentice BIRCH, RN Outcome: Progressing 04/20/2024 1920 by Gwenn Prentice BIRCH, RN Outcome:  Progressing   Problem: Skin Integrity: Goal: Risk for impaired skin integrity will decrease 04/20/2024 1921 by Gwenn Prentice BIRCH, RN Outcome: Progressing 04/20/2024 1920 by Gwenn Prentice BIRCH, RN Outcome: Progressing   Problem: Tissue Perfusion: Goal: Adequacy of tissue perfusion will improve 04/20/2024 1921 by Gwenn Prentice BIRCH, RN Outcome: Progressing 04/20/2024 1920 by Gwenn Prentice BIRCH, RN Outcome: Progressing   Problem: Fluid Volume: Goal: Hemodynamic stability will improve 04/20/2024 1921 by Gwenn Prentice BIRCH, RN Outcome: Progressing 04/20/2024 1920 by Gwenn Prentice BIRCH, RN Outcome: Progressing   Problem: Clinical Measurements: Goal: Diagnostic test results will improve 04/20/2024 1921 by Gwenn Prentice BIRCH, RN Outcome: Progressing 04/20/2024 1920 by Gwenn Prentice BIRCH, RN Outcome: Progressing Goal: Signs and symptoms of infection will decrease 04/20/2024 1921 by Gwenn Prentice BIRCH, RN Outcome: Progressing 04/20/2024 1920 by Gwenn Prentice BIRCH, RN Outcome: Progressing   Problem: Respiratory: Goal: Ability to maintain adequate ventilation will improve 04/20/2024 1921 by Gwenn Prentice BIRCH, RN Outcome: Progressing 04/20/2024 1920 by Gwenn Prentice BIRCH, RN Outcome: Progressing   Problem: Education: Goal: Knowledge of General Education information will improve Description: Including pain rating scale, medication(s)/side effects and non-pharmacologic comfort measures 04/20/2024 1921 by Gwenn Prentice BIRCH, RN Outcome: Progressing 04/20/2024 1920 by Gwenn Prentice BIRCH, RN Outcome: Progressing   Problem: Health Behavior/Discharge Planning: Goal: Ability to manage health-related needs will improve 04/20/2024 1921 by Gwenn Prentice BIRCH, RN Outcome: Progressing 04/20/2024 1920 by Gwenn Prentice BIRCH, RN Outcome: Progressing   Problem: Clinical Measurements: Goal: Ability to maintain clinical measurements within normal limits will improve 04/20/2024 1921 by Gwenn Prentice BIRCH, RN Outcome:  Progressing 04/20/2024 1920 by Gwenn Prentice BIRCH, RN Outcome: Progressing Goal: Will remain free from infection 04/20/2024 1921 by Gwenn Prentice BIRCH, RN Outcome: Progressing 04/20/2024 1920 by Gwenn Prentice BIRCH, RN Outcome: Progressing Goal: Diagnostic test results will improve 04/20/2024 1921 by Gwenn Prentice BIRCH, RN Outcome: Progressing  04/20/2024 1920 by Gwenn Prentice BIRCH, RN Outcome: Progressing Goal: Respiratory complications will improve 04/20/2024 1921 by Gwenn Prentice BIRCH, RN Outcome: Progressing 04/20/2024 1920 by Gwenn Prentice BIRCH, RN Outcome: Progressing Goal: Cardiovascular complication will be avoided 04/20/2024 1921 by Gwenn Prentice BIRCH, RN Outcome: Progressing 04/20/2024 1920 by Gwenn Prentice BIRCH, RN Outcome: Progressing   Problem: Activity: Goal: Risk for activity intolerance will decrease 04/20/2024 1921 by Gwenn Prentice BIRCH, RN Outcome: Progressing 04/20/2024 1920 by Gwenn Prentice BIRCH, RN Outcome: Progressing   Problem: Nutrition: Goal: Adequate nutrition will be maintained 04/20/2024 1921 by Gwenn Prentice BIRCH, RN Outcome: Progressing 04/20/2024 1920 by Gwenn Prentice BIRCH, RN Outcome: Progressing   Problem: Coping: Goal: Level of anxiety will decrease 04/20/2024 1921 by Gwenn Prentice BIRCH, RN Outcome: Progressing 04/20/2024 1920 by Gwenn Prentice BIRCH, RN Outcome: Progressing   Problem: Elimination: Goal: Will not experience complications related to bowel motility 04/20/2024 1921 by Gwenn Prentice BIRCH, RN Outcome: Progressing 04/20/2024 1920 by Gwenn Prentice BIRCH, RN Outcome: Progressing Goal: Will not experience complications related to urinary retention 04/20/2024 1921 by Gwenn Prentice BIRCH, RN Outcome: Progressing 04/20/2024 1920 by Gwenn Prentice BIRCH, RN Outcome: Progressing   Problem: Pain Managment: Goal: General experience of comfort will improve and/or be controlled 04/20/2024 1921 by Gwenn Prentice BIRCH, RN Outcome: Progressing 04/20/2024 1920 by Gwenn Prentice BIRCH, RN Outcome: Progressing   Problem: Safety: Goal: Ability to remain free from injury will improve 04/20/2024 1921 by Gwenn Prentice BIRCH, RN Outcome: Progressing 04/20/2024 1920 by Gwenn Prentice BIRCH, RN Outcome: Progressing   Problem: Skin Integrity: Goal: Risk for impaired skin integrity will decrease 04/20/2024 1921 by Gwenn Prentice BIRCH, RN Outcome: Progressing 04/20/2024 1920 by Gwenn Prentice BIRCH, RN Outcome: Progressing   "

## 2024-04-20 NOTE — Progress Notes (Addendum)
 " [facility]  ROUNDING NOTE   Subjective:   Patient seen sitting up in chair Alert Family at bedside Room air Feels stronger today  Creatinine 1.40  Objective:  Vital signs in last 24 hours:  Temp:  [97.4 F (36.3 C)-98 F (36.7 C)] 97.4 F (36.3 C) (01/15 1249) Pulse Rate:  [98-108] 108 (01/15 1249) Resp:  [16-20] 17 (01/15 1249) BP: (101-128)/(52-93) 112/57 (01/15 1249) SpO2:  [92 %-97 %] 96 % (01/15 1249) Weight:  [85.1 kg] 85.1 kg (01/15 0500)  Weight change: -0.1 kg Filed Weights   04/18/24 0500 04/19/24 0450 04/20/24 0500  Weight: 85 kg 85.2 kg 85.1 kg    Intake/Output: I/O last 3 completed shifts: In: 1160.8 [P.O.:460; I.V.:700.8] Out: -    Intake/Output this shift:  No intake/output data recorded.  Physical Exam: General: NAD  Head: Normocephalic, atraumatic. Moist oral mucosal membranes  Eyes: Anicteric  Lungs:  Clear to auscultation, normal effort  Heart: Irregular rate and rhythm  Abdomen:  Soft, nontender  Extremities:  No peripheral edema.  Neurologic: Awake, alert, conversant  Skin: Warm,dry, no rash       Basic Metabolic Panel: Recent Labs  Lab 04/15/24 2220 04/16/24 0450 04/16/24 1013 04/17/24 0451 04/17/24 1952 04/18/24 0358 04/19/24 0402 04/20/24 0451  NA 132* 131*  --  132*  --  133* 135 137  K 5.3* 5.7*   < > 5.4* 4.9 5.0 4.3 4.6  CL 97* 98  --  94*  --  94* 97* 99  CO2 19* 20*  --  21*  --  23 23 26   GLUCOSE 305* 364*  --  194*  --  104* 160* 112*  BUN 47* 48*  --  67*  --  82* 70* 50*  CREATININE 1.30* 1.21*  --  1.94*  --  2.19* 1.59* 1.40*  CALCIUM  9.4 8.8*  --  8.9  --  8.5* 8.2* 8.8*  MG 2.4  --   --  2.5*  --   --   --   --    < > = values in this interval not displayed.    Liver Function Tests: Recent Labs  Lab 04/15/24 2220  AST 20  ALT 23  ALKPHOS 72  BILITOT 0.4  PROT 8.0  ALBUMIN 4.4   No results for input(s): LIPASE, AMYLASE in the last 168 hours. No results for input(s): AMMONIA  in the last 168 hours.  CBC: Recent Labs  Lab 04/15/24 2220 04/16/24 0450 04/17/24 0451 04/18/24 0358 04/19/24 0402 04/20/24 0451  WBC 14.6* 14.0* 17.8* 18.6* 12.6* 10.9*  NEUTROABS 9.9*  --   --   --   --   --   HGB 11.1* 10.1* 10.5* 10.3* 10.6* 10.6*  HCT 35.3* 32.2* 33.2* 32.5* 32.7* 33.7*  MCV 92.2 92.0 90.0 90.8 89.1 91.6  PLT 350 321 321 377 286 266    Cardiac Enzymes: No results for input(s): CKTOTAL, CKMB, CKMBINDEX, TROPONINI in the last 168 hours.  BNP: Invalid input(s): POCBNP  CBG: Recent Labs  Lab 04/19/24 1154 04/19/24 1659 04/19/24 2024 04/20/24 0857 04/20/24 1251  GLUCAP 252* 234* 162* 260* 183*    Microbiology: Results for orders placed or performed during the hospital encounter of 04/15/24  Resp panel by RT-PCR (RSV, Flu A&B, Covid) Anterior Nasal Swab     Status: None   Collection Time: 04/15/24 10:20 PM   Specimen: Anterior Nasal Swab  Result Value Ref Range Status   SARS Coronavirus 2 by RT PCR  NEGATIVE NEGATIVE Final    Comment: (NOTE) SARS-CoV-2 target nucleic acids are NOT DETECTED.  The SARS-CoV-2 RNA is generally detectable in upper respiratory specimens during the acute phase of infection. The lowest concentration of SARS-CoV-2 viral copies this assay can detect is 138 copies/mL. A negative result does not preclude SARS-Cov-2 infection and should not be used as the sole basis for treatment or other patient management decisions. A negative result may occur with  improper specimen collection/handling, submission of specimen other than nasopharyngeal swab, presence of viral mutation(s) within the areas targeted by this assay, and inadequate number of viral copies(<138 copies/mL). A negative result must be combined with clinical observations, patient history, and epidemiological information. The expected result is Negative.  Fact Sheet for Patients:  bloggercourse.com  Fact Sheet for Healthcare  Providers:  seriousbroker.it  This test is no t yet approved or cleared by the [place]  FDA and  has been authorized for detection and/or diagnosis of SARS-CoV-2 by FDA under an Emergency Use Authorization (EUA). This EUA will remain  in effect (meaning this test can be used) for the duration of the COVID-19 declaration under Section 564(b)(1) of the Act, 21 U.S.C.section 360bbb-3(b)(1), unless the authorization is terminated  or revoked sooner.       Influenza A by PCR NEGATIVE NEGATIVE Final   Influenza B by PCR NEGATIVE NEGATIVE Final    Comment: (NOTE) The Xpert Xpress SARS-CoV-2/FLU/RSV plus assay is intended as an aid in the diagnosis of influenza from Nasopharyngeal swab specimens and should not be used as a sole basis for treatment. Nasal washings and aspirates are unacceptable for Xpert Xpress SARS-CoV-2/FLU/RSV testing.  Fact Sheet for Patients: bloggercourse.com  Fact Sheet for Healthcare Providers: seriousbroker.it  This test is not yet approved or cleared by the [place]  FDA and has been authorized for detection and/or diagnosis of SARS-CoV-2 by FDA under an Emergency Use Authorization (EUA). This EUA will remain in effect (meaning this test can be used) for the duration of the COVID-19 declaration under Section 564(b)(1) of the Act, 21 U.S.C. section 360bbb-3(b)(1), unless the authorization is terminated or revoked.     Resp Syncytial Virus by PCR NEGATIVE NEGATIVE Final    Comment: (NOTE) Fact Sheet for Patients: bloggercourse.com  Fact Sheet for Healthcare Providers: seriousbroker.it  This test is not yet approved or cleared by the [place]  FDA and has been authorized for detection and/or diagnosis of SARS-CoV-2 by FDA under an Emergency Use Authorization (EUA). This EUA will remain in effect (meaning this test can be  used) for the duration of the COVID-19 declaration under Section 564(b)(1) of the Act, 21 U.S.C. section 360bbb-3(b)(1), unless the authorization is terminated or revoked.  Performed at [facility], [address] [place], [place] [address]   Blood culture (routine x 2)     Status: None   Collection Time: 04/15/24 10:20 PM   Specimen: BLOOD  Result Value Ref Range Status   Specimen Description BLOOD BLOOD LEFT ARM  Final   Special Requests   Final    BOTTLES DRAWN AEROBIC AND ANAEROBIC Blood Culture adequate volume   Culture   Final    NO GROWTH 5 DAYS Performed at [facility], [address], [place], [place] [address]    Report Status 04/20/2024 FINAL  Final  Blood culture (routine x 2)     Status: None   Collection Time: 04/15/24 10:20 PM   Specimen: BLOOD  Result Value Ref Range Status   Specimen Description BLOOD  BLOOD RIGHT FOREARM  Final   Special Requests   Final    BOTTLES DRAWN AEROBIC AND ANAEROBIC Blood Culture adequate volume   Culture   Final    NO GROWTH 5 DAYS Performed at [facility], [address] [place], [place] [address]    Report Status 04/20/2024 FINAL  Final  Respiratory (~20 pathogens) panel by PCR     Status: None   Collection Time: 04/16/24  3:01 PM   Specimen: Nasopharyngeal Swab; Respiratory  Result Value Ref Range Status   Adenovirus NOT DETECTED NOT DETECTED Final   Coronavirus 229E NOT DETECTED NOT DETECTED Final    Comment: (NOTE) The Coronavirus on the Respiratory Panel, DOES NOT test for the novel  Coronavirus (2019 nCoV)    Coronavirus HKU1 NOT DETECTED NOT DETECTED Final   Coronavirus NL63 NOT DETECTED NOT DETECTED Final   Coronavirus OC43 NOT DETECTED NOT DETECTED Final   Metapneumovirus NOT DETECTED NOT DETECTED Final   Rhinovirus / Enterovirus NOT DETECTED NOT DETECTED Final   Influenza A NOT DETECTED NOT DETECTED Final   Influenza B NOT DETECTED NOT DETECTED Final   Parainfluenza Virus 1 NOT  DETECTED NOT DETECTED Final   Parainfluenza Virus 2 NOT DETECTED NOT DETECTED Final   Parainfluenza Virus 3 NOT DETECTED NOT DETECTED Final   Parainfluenza Virus 4 NOT DETECTED NOT DETECTED Final   Respiratory Syncytial Virus NOT DETECTED NOT DETECTED Final   Bordetella pertussis NOT DETECTED NOT DETECTED Final   Bordetella Parapertussis NOT DETECTED NOT DETECTED Final   Chlamydophila pneumoniae NOT DETECTED NOT DETECTED Final   Mycoplasma pneumoniae NOT DETECTED NOT DETECTED Final    Comment: Performed at [facility], [address], [place], [place] [address]    Coagulation Studies: No results for input(s): LABPROT, INR in the last 72 hours.   Urinalysis: No results for input(s): COLORURINE, LABSPEC, PHURINE, GLUCOSEU, HGBUR, BILIRUBINUR, KETONESUR, PROTEINUR, UROBILINOGEN, NITRITE, LEUKOCYTESUR in the last 72 hours.  Invalid input(s): APPERANCEUR    Imaging: No results found.    Medications:      amiodarone   400 mg Oral BID   Followed by   NOREEN ON 04/27/2024] amiodarone   200 mg Oral Daily   apixaban   5 mg Oral BID   budesonide  (PULMICORT ) nebulizer solution  0.25 mg Nebulization BID   carbamazepine   200 mg Oral Daily   cholecalciferol   2,000 Units Oral Daily   diclofenac  Sodium  4 g Topical QID   doxycycline   100 mg Oral BID   DULoxetine   60 mg Oral Daily   empagliflozin   25 mg Oral Daily   guaiFENesin   600 mg Oral BID   insulin  aspart  0-15 Units Subcutaneous TID WC   insulin  aspart  0-5 Units Subcutaneous QHS   levothyroxine   112 mcg Oral Q0600   metoprolol  succinate  75 mg Oral BID   pantoprazole   40 mg Oral Daily   predniSONE   40 mg Oral Q breakfast   rosuvastatin   40 mg Oral Daily   saccharomyces boulardii  250 mg Oral BID   acetaminophen  **OR** acetaminophen , chlorpheniramine-HYDROcodone , diazepam , HYDROcodone -acetaminophen , ipratropium, levalbuterol , magnesium  hydroxide, menthol , methocarbamol , ondansetron  **OR**  ondansetron  (ZOFRAN ) IV, mouth rinse, traZODone   Assessment/ Plan:  [name] is a 69 y.o.  female with past medical conditions including hypertension, diabetes type 2, fibromyalgia, COPD, aortic insufficiency, and hypothyroidism, chronic kidney disease stage II, who was admitted to [facility] on 04/15/2024 for Bronchospasm [J98.01] Left bundle branch block [I44.7] Atrial fibrillation with rapid ventricular response (  HCC) [I48.91] Atrial fibrillation with RVR (HCC) [I48.91] TSH elevation [R79.89] Congestive heart failure, unspecified HF chronicity, unspecified heart failure type (HCC) [I50.9]   Acute kidney injury on chronic kidney disease stage II. Baseline creatinine 1.0 with GFR 61 in August. Acute kidney injury likely multifactorial, IV diuresis and dysrhythmia. Will order renal US  for obstruction. Will hold IV diuresis for now.  Creatinine continues to improve. Would recommend holding diuretics until outpatient followup. Encouraged patient to monitor fluid intake.   Lab Results  Component Value Date   CREATININE 1.40 (H) 04/20/2024   CREATININE 1.59 (H) 04/19/2024   CREATININE 2.19 (H) 04/18/2024    Intake/Output Summary (Last 24 hours) at 04/20/2024 1351 Last data filed at 04/20/2024 0115 Gross per 24 hour  Intake 545.19 ml  Output --  Net 545.19 ml   2. Hyperkalemia/hyponatremia.  Sodium and potassium remain stable  3. Anemia of chronic kidney disease  Lab Results  Component Value Date   HGB 10.6 (L) 04/20/2024    Hgb 10.6, acceptable.   4. Acute systolic CHF, ECHO from 1/11 shows EF 30-35 % with global hypokinesis. Questionable endocarditis, recommend TEE for evaluation. Furosemide  held   5. Secondary Hyperparathyroidism: with outpatient labs: PTH 57, phosphorus 3.5, calcium  9.0 on 12/01/23.   Lab Results  Component Value Date   CALCIUM  8.8 (L) 04/20/2024   Calcium  8.8, within goal  Due to renal recovery, we will sign off at this time.    LOS: 4 Saverio Kader 1/15/20261:51 PM   "

## 2024-04-20 NOTE — Inpatient Diabetes Management (Signed)
 Inpatient Diabetes Program Recommendations  AACE/ADA: New Consensus Statement on Inpatient Glycemic Control (2015)  Target Ranges:  Prepandial:   less than 140 mg/dL      Peak postprandial:   less than 180 mg/dL (1-2 hours)      Critically ill patients:  140 - 180 mg/dL    Latest Reference Range & Units 04/19/24 08:02 04/19/24 11:54 04/19/24 16:59 04/19/24 20:24  Glucose-Capillary 70 - 99 mg/dL 775 (H)  5 units Novolog   252 (H)  8 units Novolog   234 (H)  5 units Novolog   162 (H)  (H): Data is abnormally high  Latest Reference Range & Units 04/20/24 08:57  Glucose-Capillary 70 - 99 mg/dL 739 (H)  (H): Data is abnormally high     Home DM Meds: Jardiance  25 mg daily Glucotrol  10 mg daily Metformin 1000 mg BID   Current Orders: Novolog  Moderate Correction Scale/ SSI (0-15 units) TID AC + HS Jardiance  25 mg daily     MD- Note pt getting Prednisone  40 mg daily CBGs >200  Please consider:  1. Start low dose basal insulin : Lantus 8 units daily (0.1 units/kg)  2. Start low dose Novolog  Meal Coverage: Novolog  2 units TID with meals HOLD if pt NPO HOLD if pt eats <50% meals    --Will follow patient during hospitalization--  Adina Rudolpho Arrow RN, MSN, CDCES Diabetes Coordinator Inpatient Glycemic Control Team Team Pager: [phone] (8a-5p)

## 2024-04-20 NOTE — Progress Notes (Signed)
 [facility] CLINIC CARDIOLOGY PROGRESS NOTE   Patient ID: Amber Stevenson MRN: [id number] DOB/AGE: April 09, 1955 69 y.o.  Admit date: 04/15/2024 Referring Physician Dr. Madison Peaches Primary Physician Delfina Pao, MD  Primary Cardiologist Dr. Ammon (last seen 2022) Reason for Consultation wide-complex tachycardia, acute heart failure  HPI: Amber Stevenson is a 69 y.o. female with a past medical history of hypertension, COPD, type 2 diabetes, depression who presented to the ED on 04/15/2024 for worsening shortness of breath.  Initial EKG with wide-complex tachycardia, A-fib with aberrancy.  Cardiology was consulted for further evaluation.   Interval History:  - Patient seen and examined this morning, resting comfortably in hospital bed with family at bedside. - Denies SOB, CP, palpitations. On room air. HR somewhat improved. BP stable.  - Creatinine continues to improve, nephro following.  Review of systems complete and found to be negative unless listed above   Vitals:   04/20/24 0342 04/20/24 0500 04/20/24 0732 04/20/24 0857  BP: (!) 105/93   (!) 101/52  Pulse: (!) 105   (!) 107  Resp: 18   17  Temp: 98 F (36.7 C)   97.6 F (36.4 C)  TempSrc: Oral     SpO2: 94%  92% 96%  Weight:  85.1 kg    Height:         Intake/Output Summary (Last 24 hours) at 04/20/2024 1019 Last data filed at 04/20/2024 0115 Gross per 24 hour  Intake 545.19 ml  Output --  Net 545.19 ml     PHYSICAL EXAM General: Chronically ill appearing female, well nourished, in no acute distress. HEENT: Normocephalic and atraumatic. Neck: No JVD.  Lungs: Normal respiratory effort on room air. Clear bilaterally to auscultation. No wheezes, crackles, rhonchi.  Heart: Irregularly irregular, controlled rate. Normal S1 and S2 without gallops or murmurs. Radial & DP pulses 2+ bilaterally. Abdomen: Non-distended appearing.  Msk: Normal strength and tone for age. Extremities: No clubbing, cyanosis or edema.   Neuro:  Alert and oriented X 3. Psych: Mood appropriate, affect congruent.    LABS: Basic Metabolic Panel: Recent Labs    04/19/24 0402 04/20/24 0451  NA 135 137  K 4.3 4.6  CL 97* 99  CO2 23 26  GLUCOSE 160* 112*  BUN 70* 50*  CREATININE 1.59* 1.40*  CALCIUM  8.2* 8.8*   Liver Function Tests: No results for input(s): AST, ALT, ALKPHOS, BILITOT, PROT, ALBUMIN in the last 72 hours.  No results for input(s): LIPASE, AMYLASE in the last 72 hours. CBC: Recent Labs    04/19/24 0402 04/20/24 0451  WBC 12.6* 10.9*  HGB 10.6* 10.6*  HCT 32.7* 33.7*  MCV 89.1 91.6  PLT 286 266   Cardiac Enzymes: No results for input(s): CKTOTAL, CKMB, CKMBINDEX, TROPONINIHS in the last 72 hours. BNP: No results for input(s): BNP in the last 72 hours. D-Dimer: No results for input(s): DDIMER in the last 72 hours. Hemoglobin A1C: No results for input(s): HGBA1C in the last 72 hours.  Fasting Lipid Panel: No results for input(s): CHOL, HDL, LDLCALC, TRIG, CHOLHDL, LDLDIRECT in the last 72 hours. Thyroid  Function Tests: No results for input(s): TSH, T4TOTAL, T3FREE, THYROIDAB in the last 72 hours.  Invalid input(s): FREET3  Anemia Panel: No results for input(s): VITAMINB12, FOLATE, FERRITIN, TIBC, IRON, RETICCTPCT in the last 72 hours.  No results found.    ECHO as above  TELEMETRY (personally reviewed): atrial fibrillation LBBB rate 90s  EKG (personally reviewed): Atrial fibrillation RVR LBBB rate 150 bpm  DATA reviewed by me  04/20/24: last 24h vitals tele labs imaging I/O, hospitalist progress note  Principal Problem:   Atrial fibrillation with RVR (HCC) Active Problems:   Acute CHF (congestive heart failure) (HCC)   CAP (community acquired pneumonia)   Dyslipidemia   AKI (acute kidney injury)   Uncontrolled type 2 diabetes mellitus with hyperglycemia, without long-term current use of insulin  (HCC)   GERD without  esophagitis   COPD with acute exacerbation (HCC)    ASSESSMENT AND PLAN: Amber Stevenson is a 69 y.o. female with a past medical history of hypertension, COPD, type 2 diabetes, depression who presented to the ED on 04/15/2024 for worsening shortness of breath.  Initial EKG with wide-complex tachycardia, A-fib with aberrancy.  Cardiology was consulted for further evaluation.   # Atrial fibrillation RVR with abberancy # Acute HFrEF # Community acquired pneumonia # Acute COPD exacerbation # AKI on CKD Patient presented with worsening shortness of breath, also found to be in wide-complex tachycardia on EKG which appears to be atrial fibrillation with aberrancy.  Rate overall improved with IV amiodarone  and digoxin .  Echo this admission with a EF 30-35%, global hypokinesis, moderate MR with echodensity of mitral valve chord, moderate to severe AR. - Transition to p.o. amiodarone  400 mg twice daily for 7 days followed by 200 mg daily. Consolidate metoprolol  succinate to 75 mg twice daily. Consider further uptitration. - Will hold off on further digoxin  dosing due to elevated dig level.  - Continue Eliquis  5 mg twice daily.  This plan was discussed with pharmacy as she is currently on carbamazepine  which has a DDI.  Primary team discussed with psych who advised it was ok to decrease carbamazepine  dose.  - Holding off on further IV diuresis.  Nephrology has been consulted, appreciate their evaluation. - Continue Jardiance  25 mg daily. - Continue Crestor  40 mg daily. - Can consider cardiac MRI, other ischemic evaluation outpatient.  This patient's case was discussed and created with Dr. Florencio and he is in agreement.  Signed:  Danita Bloch, PA-C  04/20/2024, 10:19 AM [facility] Cardiology

## 2024-04-20 NOTE — Plan of Care (Signed)

## 2024-04-20 NOTE — Progress Notes (Signed)
 PT Cancellation Note  Patient Details Name: Amber Stevenson MRN: [id number] DOB: May 11, 1955   Cancelled Treatment:    Reason Eval/Treat Not Completed: Patient declined, no reason specified PT attempted. Pt sleep in room. Pt daughter in room. PT check back at later time.    Fronie Holstein A Hina Gupta 04/20/2024, 11:12 AM

## 2024-04-20 NOTE — Progress Notes (Signed)
 " PROGRESS NOTE    Amber Stevenson  FMW:[id number] DOB: 02-23-1956 DOA: 04/15/2024 PCP: Delfina Pao, MD  Chief Complaint  Patient presents with   Shortness of Breath    Hospital Course:  Amber Stevenson is a 69 y.o. female with medical history significant for depression, type II diabetes mellitus, hypertension, COPD, fibromyalgia, aortic insufficiency and hypothyroidism presented with DOE, hypoxia.  Was seen outpatient and was prescribed antibiotics and prednisone , felt better for couple days and symptoms continue to worsen.  Patient admitted for A-fib with RVR, acute heart failure, elevated troponin with new LBBB on EKG, pneumonia, COPD exacerbation.  Hospital course as below  1/14: increased metoprolol  for better HR control 1/15: Added diclofenac  gel for left hand pain  Subjective:  Feeling much better.  Having some left hand pain where she had IV insertion   Objective: Vitals:   04/20/24 0732 04/20/24 0857 04/20/24 1249 04/20/24 1658  BP:  (!) 101/52 (!) 112/57 (!) 108/57  Pulse:  (!) 107 (!) 108 75  Resp:  17 17 17   Temp:  97.6 F (36.4 C) (!) 97.4 F (36.3 C) (!) 97.4 F (36.3 C)  TempSrc:      SpO2: 92% 96% 96% 97%  Weight:      Height:        Intake/Output Summary (Last 24 hours) at 04/20/2024 1734 Last data filed at 04/20/2024 1300 Gross per 24 hour  Intake 545.19 ml  Output --  Net 545.19 ml   Filed Weights   04/18/24 0500 04/19/24 0450 04/20/24 0500  Weight: 85 kg 85.2 kg 85.1 kg    Examination: GENERAL:  69 y.o.-year-old female patient lying in the bed with no acute distress.  NECK:  Supple, no jugular venous distention. No thyroid  enlargement, no tenderness.  LUNGS: CTA b/l.  CARDIOVASCULAR:  irregular, S1, S2 normal. No murmurs, rubs, or gallops.  ABDOMEN: Soft, nondistended, nontender. Bowel sounds present. No organomegaly or mass.  EXTREMITIES: No pedal edema, cyanosis, or clubbing.  NEUROLOGIC: AO x 3, no gross focal deficits SKIN: No obvious  rash, lesion, or ulcer  Assessment & Plan:  Atrial fibrillation with RVR S/p IV digoxin  on admission, continue IV Amiodarone  and metoprolol  for better HR control.  Transitioning to oral amiodarone  Transition to p.o. amiodarone  400 mg twice daily for 7 days followed by 200 mg daily  On Eliquis  5 mg bid Cardiology following   Acute HFrEF, new diagnosis Echo shows EF 30-35%, global hypokinesis, moderate MR with echodensity of mitral valve chord, moderate to severe AR  Lasix  held. Continue Jardiance , metoprolol   Consider cardiac MRI once respiratory status improves -as an outpatient - Continue Crestor  40 mg daily.  Elevated troponin due to supply/demand ischemia EKG with new LBBB Denies chest pain.  CAP (community acquired pneumonia) Lactic acidosis Lactic acidosis, leukocytosis Flu/COVID/RSV negative Continue doxycycline .  Completed IV Rocephin .  Will stop doxycycline  at discharge tomorrow  COPD with acute exacerbation IV Solu-Medrol  changed to po prednisone , Xopenex , ipratropium nebs due to tachycardia  AKI on CKD stage 3a/b Cr  2.19 -> 1.4, baseline Cr ~ 1.2 Hold diuresis US  renal negative for hydronephrosis  Nephrology following Monitor creatinine  Hyperkalemia - resolved   Uncontrolled type 2 diabetes mellitus with hyperglycemia, without long-term current use of insulin  Steroid Induced hyperglycemia HbA1c 8.0 Hold metformin, glipizide .  Resume at discharge SSI  Normocytic anemia Monitor Hb  HTN Hold amlodipine , resume as BP tolerates losartan  on hold due to AKI  Hypothyroidism TSH 5.15, FT4 0.93 Continue home levothyroxine   GERD  without esophagitis PPI   Dyslipidemia Continue Crestor   Left hand pain/swelling likely due to IV insertion site injury Use diclofenac  gel  Anxiety Bipolar disorder On Valium  as needed On carbamazepine  400 mg -> decreased to 200mg  daily, discussed with psychiatry. No hx of seizures Dose decreased due to interactions with  Eliquis , did not want to be on Coumadin  Obesity Class I Body mass index is 34.31 kg/m. Outpatient follow up for lifestyle modification and risk factor management  -- PT/OT rec SNF   DVT prophylaxis: Eliquis    Code Status: Full Code Disposition:  SNF  Consultants:  Treatment Team:  Consulting Physician: Florencio Cara BIRCH, MD Nephrology   Antimicrobials:  Anti-infectives (From admission, onward)    Start     Dose/Rate Route Frequency Ordered Stop   04/16/24 1200  cefTRIAXone  (ROCEPHIN ) 2 g in sodium chloride  0.9 % 100 mL IVPB        2 g 200 mL/hr over 30 Minutes Intravenous Every 24 hours 04/16/24 0418 04/20/24 1245   04/16/24 1000  doxycycline  (VIBRA -TABS) tablet 100 mg        100 mg Oral 2 times daily 04/16/24 0418 04/21/24 0959   04/16/24 0500  cefTRIAXone  (ROCEPHIN ) 2 g in sodium chloride  0.9 % 100 mL IVPB  Status:  Discontinued        2 g 200 mL/hr over 30 Minutes Intravenous Every 24 hours 04/16/24 0450 04/16/24 0454   04/16/24 0000  cefTRIAXone  (ROCEPHIN ) 1 g in sodium chloride  0.9 % 100 mL IVPB        1 g 200 mL/hr over 30 Minutes Intravenous  Once 04/15/24 2358 04/16/24 0042   04/16/24 0000  doxycycline  (VIBRAMYCIN ) 100 mg in sodium chloride  0.9 % 250 mL IVPB        100 mg 125 mL/hr over 120 Minutes Intravenous  Once 04/15/24 2358 04/16/24 0305       Data Reviewed: I have personally reviewed following labs and imaging studies CBC: Recent Labs  Lab 04/15/24 2220 04/16/24 0450 04/17/24 0451 04/18/24 0358 04/19/24 0402 04/20/24 0451  WBC 14.6* 14.0* 17.8* 18.6* 12.6* 10.9*  NEUTROABS 9.9*  --   --   --   --   --   HGB 11.1* 10.1* 10.5* 10.3* 10.6* 10.6*  HCT 35.3* 32.2* 33.2* 32.5* 32.7* 33.7*  MCV 92.2 92.0 90.0 90.8 89.1 91.6  PLT 350 321 321 377 286 266   Basic Metabolic Panel: Recent Labs  Lab 04/15/24 2220 04/16/24 0450 04/16/24 1013 04/17/24 0451 04/17/24 1952 04/18/24 0358 04/19/24 0402 04/20/24 0451  NA 132* 131*  --  132*  --  133*  135 137  K 5.3* 5.7*   < > 5.4* 4.9 5.0 4.3 4.6  CL 97* 98  --  94*  --  94* 97* 99  CO2 19* 20*  --  21*  --  23 23 26   GLUCOSE 305* 364*  --  194*  --  104* 160* 112*  BUN 47* 48*  --  67*  --  82* 70* 50*  CREATININE 1.30* 1.21*  --  1.94*  --  2.19* 1.59* 1.40*  CALCIUM  9.4 8.8*  --  8.9  --  8.5* 8.2* 8.8*  MG 2.4  --   --  2.5*  --   --   --   --    < > = values in this interval not displayed.   GFR: Estimated Creatinine Clearance: 37.8 mL/min (A) (by C-G formula based on SCr of 1.4 mg/dL (H)).  Liver Function Tests: Recent Labs  Lab 04/15/24 2220  AST 20  ALT 23  ALKPHOS 72  BILITOT 0.4  PROT 8.0  ALBUMIN 4.4   CBG: Recent Labs  Lab 04/19/24 1659 04/19/24 2024 04/20/24 0857 04/20/24 1251 04/20/24 1641  GLUCAP 234* 162* 260* 183* 287*    Recent Results (from the past 240 hours)  Resp panel by RT-PCR (RSV, Flu A&B, Covid) Anterior Nasal Swab     Status: None   Collection Time: 04/15/24 10:20 PM   Specimen: Anterior Nasal Swab  Result Value Ref Range Status   SARS Coronavirus 2 by RT PCR NEGATIVE NEGATIVE Final    Comment: (NOTE) SARS-CoV-2 target nucleic acids are NOT DETECTED.  The SARS-CoV-2 RNA is generally detectable in upper respiratory specimens during the acute phase of infection. The lowest concentration of SARS-CoV-2 viral copies this assay can detect is 138 copies/mL. A negative result does not preclude SARS-Cov-2 infection and should not be used as the sole basis for treatment or other patient management decisions. A negative result may occur with  improper specimen collection/handling, submission of specimen other than nasopharyngeal swab, presence of viral mutation(s) within the areas targeted by this assay, and inadequate number of viral copies(<138 copies/mL). A negative result must be combined with clinical observations, patient history, and epidemiological information. The expected result is Negative.  Fact Sheet for Patients:   bloggercourse.com  Fact Sheet for Healthcare Providers:  seriousbroker.it  This test is no t yet approved or cleared by the [place]  FDA and  has been authorized for detection and/or diagnosis of SARS-CoV-2 by FDA under an Emergency Use Authorization (EUA). This EUA will remain  in effect (meaning this test can be used) for the duration of the COVID-19 declaration under Section 564(b)(1) of the Act, 21 U.S.C.section 360bbb-3(b)(1), unless the authorization is terminated  or revoked sooner.       Influenza A by PCR NEGATIVE NEGATIVE Final   Influenza B by PCR NEGATIVE NEGATIVE Final    Comment: (NOTE) The Xpert Xpress SARS-CoV-2/FLU/RSV plus assay is intended as an aid in the diagnosis of influenza from Nasopharyngeal swab specimens and should not be used as a sole basis for treatment. Nasal washings and aspirates are unacceptable for Xpert Xpress SARS-CoV-2/FLU/RSV testing.  Fact Sheet for Patients: bloggercourse.com  Fact Sheet for Healthcare Providers: seriousbroker.it  This test is not yet approved or cleared by the [place]  FDA and has been authorized for detection and/or diagnosis of SARS-CoV-2 by FDA under an Emergency Use Authorization (EUA). This EUA will remain in effect (meaning this test can be used) for the duration of the COVID-19 declaration under Section 564(b)(1) of the Act, 21 U.S.C. section 360bbb-3(b)(1), unless the authorization is terminated or revoked.     Resp Syncytial Virus by PCR NEGATIVE NEGATIVE Final    Comment: (NOTE) Fact Sheet for Patients: bloggercourse.com  Fact Sheet for Healthcare Providers: seriousbroker.it  This test is not yet approved or cleared by the [place]  FDA and has been authorized for detection and/or diagnosis of SARS-CoV-2 by FDA under an Emergency Use  Authorization (EUA). This EUA will remain in effect (meaning this test can be used) for the duration of the COVID-19 declaration under Section 564(b)(1) of the Act, 21 U.S.C. section 360bbb-3(b)(1), unless the authorization is terminated or revoked.  Performed at [facility], [address] [place], [place] [address]   Blood culture (routine x 2)     Status: None   Collection Time: 04/15/24 10:20 PM  Specimen: BLOOD  Result Value Ref Range Status   Specimen Description BLOOD BLOOD LEFT ARM  Final   Special Requests   Final    BOTTLES DRAWN AEROBIC AND ANAEROBIC Blood Culture adequate volume   Culture   Final    NO GROWTH 5 DAYS Performed at [facility], [address] [place], [place] [address]    Report Status 04/20/2024 FINAL  Final  Blood culture (routine x 2)     Status: None   Collection Time: 04/15/24 10:20 PM   Specimen: BLOOD  Result Value Ref Range Status   Specimen Description BLOOD BLOOD RIGHT FOREARM  Final   Special Requests   Final    BOTTLES DRAWN AEROBIC AND ANAEROBIC Blood Culture adequate volume   Culture   Final    NO GROWTH 5 DAYS Performed at [facility], [address] [place], [place] [address]    Report Status 04/20/2024 FINAL  Final  Respiratory (~20 pathogens) panel by PCR     Status: None   Collection Time: 04/16/24  3:01 PM   Specimen: Nasopharyngeal Swab; Respiratory  Result Value Ref Range Status   Adenovirus NOT DETECTED NOT DETECTED Final   Coronavirus 229E NOT DETECTED NOT DETECTED Final    Comment: (NOTE) The Coronavirus on the Respiratory Panel, DOES NOT test for the novel  Coronavirus (2019 nCoV)    Coronavirus HKU1 NOT DETECTED NOT DETECTED Final   Coronavirus NL63 NOT DETECTED NOT DETECTED Final   Coronavirus OC43 NOT DETECTED NOT DETECTED Final   Metapneumovirus NOT DETECTED NOT DETECTED Final   Rhinovirus / Enterovirus NOT DETECTED NOT DETECTED Final   Influenza A NOT DETECTED NOT DETECTED  Final   Influenza B NOT DETECTED NOT DETECTED Final   Parainfluenza Virus 1 NOT DETECTED NOT DETECTED Final   Parainfluenza Virus 2 NOT DETECTED NOT DETECTED Final   Parainfluenza Virus 3 NOT DETECTED NOT DETECTED Final   Parainfluenza Virus 4 NOT DETECTED NOT DETECTED Final   Respiratory Syncytial Virus NOT DETECTED NOT DETECTED Final   Bordetella pertussis NOT DETECTED NOT DETECTED Final   Bordetella Parapertussis NOT DETECTED NOT DETECTED Final   Chlamydophila pneumoniae NOT DETECTED NOT DETECTED Final   Mycoplasma pneumoniae NOT DETECTED NOT DETECTED Final    Comment: Performed at [facility], [address] [place], [place] [address]     Radiology Studies: No results found.   Scheduled Meds:  amiodarone   400 mg Oral BID   Followed by   NOREEN ON 04/27/2024] amiodarone   200 mg Oral Daily   apixaban   5 mg Oral BID   budesonide  (PULMICORT ) nebulizer solution  0.25 mg Nebulization BID   carbamazepine   200 mg Oral Daily   cholecalciferol   2,000 Units Oral Daily   diclofenac  Sodium  4 g Topical QID   doxycycline   100 mg Oral BID   DULoxetine   60 mg Oral Daily   empagliflozin   25 mg Oral Daily   guaiFENesin   600 mg Oral BID   insulin  aspart  0-15 Units Subcutaneous TID WC   insulin  aspart  0-5 Units Subcutaneous QHS   levothyroxine   112 mcg Oral Q0600   metoprolol  succinate  75 mg Oral BID   pantoprazole   40 mg Oral Daily   predniSONE   40 mg Oral Q breakfast   rosuvastatin   40 mg Oral Daily   saccharomyces boulardii  250 mg Oral BID   Continuous Infusions:      LOS: 4 days  MDM: Patient is high  risk for one or more organ failure.  They necessitate ongoing hospitalization for continued IV therapies and subsequent lab monitoring. Total time spent interpreting labs and vitals, reviewing the medical record, coordinating care amongst consultants and care team members, directly assessing and discussing care with the patient and/or family: 35 min Gurfateh Mcclain Maree, MD [facility]  To contact the attending physician between 7A-7P please use Epic Chat. To contact the covering physician during after hours 7P-7A, please review Amion.  04/20/2024, 5:34 PM   *This document has been created with the assistance of dictation software. Please excuse typographical errors. *   "

## 2024-04-20 NOTE — Progress Notes (Signed)
 Physical Therapy Treatment Patient Details Name: Amber Stevenson MRN: [id number] DOB: 06-16-1955 Today's Date: 04/20/2024   History of Present Illness Pt is a 69 y.o. female with depression, type II diabetes mellitus, hypertension, COPD, fibromyalgia, aortic insufficiency and hypothyroidism  who presented with worsening dyspnea. Chest x-ray noted findings suggesting congestive heart failure.    PT Comments  Patient seen for PT session focused on functional ambulation tolerance. Patient required CGA for 100' ambulation in hallway requiring rest breaks throughout with RW . Tolerated session fair with mild to moderate signs of exertion. Main limiting factors today were motivation for ambulation stating I just walked to the bathroom. Do I have to walk again? After PT entered the room. Interventions aimed at improving functional mobility tolerance. Patient shows good potential to make progress with continued acute level rehab. Patient continues to demonstrate mild to moderate activity restrictions and poor tolerance for progressive mobility. Continued skilled PT recommended to progress toward functional goals and support discharge readiness. Pt making good progress toward goals, will continue to follow POC. Discharge recommendation remains appropriate      If plan is discharge home, recommend the following: A little help with walking and/or transfers;A little help with bathing/dressing/bathroom;Assistance with cooking/housework;Assist for transportation;Help with stairs or ramp for entrance   Can travel by private vehicle        Equipment Recommendations  Rolling walker (2 wheels)    Recommendations for Other Services       Precautions / Restrictions Precautions Precautions: Fall Recall of Precautions/Restrictions: Impaired Restrictions Weight Bearing Restrictions Per Provider Order: No     Mobility  Bed Mobility Overal bed mobility: Needs Assistance Bed Mobility: Supine to Sit, Sit to  Supine     Supine to sit: Supervision, Used rails, HOB elevated Sit to supine: Supervision        Transfers Overall transfer level: Needs assistance Equipment used: Rolling walker (2 wheels) Transfers: Sit to/from Stand Sit to Stand: Contact guard assist                Ambulation/Gait Ambulation/Gait assistance: Min assist, Contact guard assist Gait Distance (Feet): 100 Feet Assistive device: Rolling walker (2 wheels)         General Gait Details: need to stop for rest several times throughout ambulation   Stairs             Wheelchair Mobility     Tilt Bed    Modified Rankin (Stroke Patients Only)       Balance Overall balance assessment: Needs assistance Sitting-balance support: Feet supported Sitting balance-Leahy Scale: Good     Standing balance support: Single extremity supported, During functional activity, Reliant on assistive device for balance Standing balance-Leahy Scale: Fair                              Hotel Manager: No apparent difficulties  Cognition Arousal: Alert Behavior During Therapy: WFL for tasks assessed/performed, Restless                             Following commands: Intact      Cueing Cueing Techniques: Verbal cues, Tactile cues  Exercises      General Comments        Pertinent Vitals/Pain Pain Assessment Pain Assessment: No/denies pain    Home Living  Prior Function            PT Goals (current goals can now be found in the care plan section) Acute Rehab PT Goals Patient Stated Goal: to return to PLOF PT Goal Formulation: With patient Time For Goal Achievement: 05/02/24 Potential to Achieve Goals: Good Progress towards PT goals: Progressing toward goals    Frequency    Min 2X/week      PT Plan      Co-evaluation              AM-PAC PT 6 Clicks Mobility   Outcome Measure  Help needed  turning from your back to your side while in a flat bed without using bedrails?: A Little Help needed moving from lying on your back to sitting on the side of a flat bed without using bedrails?: A Little Help needed moving to and from a bed to a chair (including a wheelchair)?: A Little Help needed standing up from a chair using your arms (e.g., wheelchair or bedside chair)?: A Little Help needed to walk in hospital room?: A Little Help needed climbing 3-5 steps with a railing? : A Lot 6 Click Score: 17    End of Session   Activity Tolerance: Patient tolerated treatment well;Patient limited by fatigue Patient left: in chair;with call bell/phone within reach;with chair alarm set Nurse Communication: Mobility status PT Visit Diagnosis: Other abnormalities of gait and mobility (R26.89);Difficulty in walking, not elsewhere classified (R26.2);Muscle weakness (generalized) (M62.81)     Time: 8849-8796 PT Time Calculation (min) (ACUTE ONLY): 13 min  Charges:      PT General Charges $$ ACUTE PT VISIT: 1 Visit                     Sherlean Lesches DPT, PT     Sherlean A Titianna Loomis 04/20/2024, 12:09 PM

## 2024-04-21 ENCOUNTER — Other Ambulatory Visit: Payer: Self-pay

## 2024-04-21 DIAGNOSIS — I447 Left bundle-branch block, unspecified: Secondary | ICD-10-CM

## 2024-04-21 DIAGNOSIS — I509 Heart failure, unspecified: Secondary | ICD-10-CM | POA: Diagnosis not present

## 2024-04-21 DIAGNOSIS — I4891 Unspecified atrial fibrillation: Secondary | ICD-10-CM | POA: Diagnosis not present

## 2024-04-21 DIAGNOSIS — J9801 Acute bronchospasm: Secondary | ICD-10-CM | POA: Diagnosis not present

## 2024-04-21 LAB — BASIC METABOLIC PANEL WITH GFR
Anion gap: 11 (ref 5–15)
BUN: 44 mg/dL — ABNORMAL HIGH (ref 8–23)
CO2: 27 mmol/L (ref 22–32)
Calcium: 9 mg/dL (ref 8.9–10.3)
Chloride: 99 mmol/L (ref 98–111)
Creatinine, Ser: 1.54 mg/dL — ABNORMAL HIGH (ref 0.44–1.00)
GFR, Estimated: 36 mL/min — ABNORMAL LOW
Glucose, Bld: 139 mg/dL — ABNORMAL HIGH (ref 70–99)
Potassium: 4.6 mmol/L (ref 3.5–5.1)
Sodium: 137 mmol/L (ref 135–145)

## 2024-04-21 LAB — CBC
HCT: 36.9 % (ref 36.0–46.0)
Hemoglobin: 11.4 g/dL — ABNORMAL LOW (ref 12.0–15.0)
MCH: 28.6 pg (ref 26.0–34.0)
MCHC: 30.9 g/dL (ref 30.0–36.0)
MCV: 92.7 fL (ref 80.0–100.0)
Platelets: 259 K/uL (ref 150–400)
RBC: 3.98 MIL/uL (ref 3.87–5.11)
RDW: 14.9 % (ref 11.5–15.5)
WBC: 12.6 K/uL — ABNORMAL HIGH (ref 4.0–10.5)
nRBC: 0 % (ref 0.0–0.2)

## 2024-04-21 LAB — GLUCOSE, CAPILLARY: Glucose-Capillary: 230 mg/dL — ABNORMAL HIGH (ref 70–99)

## 2024-04-21 MED ORDER — METOPROLOL SUCCINATE ER 25 MG PO TB24
75.0000 mg | ORAL_TABLET | Freq: Two times a day (BID) | ORAL | 0 refills | Status: AC
  Filled 2024-04-21: qty 180, 30d supply, fill #0

## 2024-04-21 MED ORDER — APIXABAN 5 MG PO TABS
5.0000 mg | ORAL_TABLET | Freq: Two times a day (BID) | ORAL | 0 refills | Status: AC
  Filled 2024-04-21: qty 60, 30d supply, fill #0

## 2024-04-21 MED ORDER — AMIODARONE HCL 200 MG PO TABS
ORAL_TABLET | ORAL | 0 refills | Status: AC
  Filled 2024-04-21: qty 54, 36d supply, fill #0

## 2024-04-21 MED ORDER — DICLOFENAC SODIUM 1 % EX GEL: 4.0000 g | Freq: Four times a day (QID) | CUTANEOUS | Status: AC

## 2024-04-21 MED ORDER — CARBAMAZEPINE ER 200 MG PO TB12
200.0000 mg | ORAL_TABLET | Freq: Every day | ORAL | 0 refills | Status: AC
  Filled 2024-04-21: qty 30, 30d supply, fill #0

## 2024-04-21 NOTE — Progress Notes (Signed)
 OT Cancellation Note  Patient Details Name: Amber Stevenson MRN: [id number] DOB: 10-Feb-1956   Cancelled Treatment:    Reason Eval/Treat Not Completed: Other (comment) Chart reviewed. Pt received in room, family present and awaiting discharge. Patient and family inquiring about [facility] services and DME recommended. Secure chat to [facility] to follow-up (per chart, OT/PT recs are for [facility] services, RW, BSC & shower bench).   Shylo Dillenbeck L. Alandra Sando, OTR/L  04/21/24, 9:53 AM

## 2024-04-21 NOTE — TOC CM/SW Note (Signed)
 Rolling Walker: The beneficiary has a mobility limitation that significantly impairs his/her ability to participate in one or more mobility-related activities of daily living (MRADL) in the home. The patient is able to safely use the walker. The functional mobility deficit can be sufficiently resolved by use of walker.

## 2024-04-21 NOTE — Progress Notes (Cosign Needed Addendum)
 [facility] CLINIC CARDIOLOGY PROGRESS NOTE   Patient ID: Amber Stevenson MRN: [id number] DOB/AGE: 71-Jul-1957 58 y.o.  Admit date: 04/15/2024 Referring Physician Dr. Madison Peaches Primary Physician Delfina Pao, MD  Primary Cardiologist Dr. Ammon (last seen 2022) Reason for Consultation wide-complex tachycardia, acute heart failure  HPI: Amber Stevenson is a 69 y.o. female with a past medical history of hypertension, COPD, type 2 diabetes, depression who presented to the ED on 04/15/2024 for worsening shortness of breath.  Initial EKG with wide-complex tachycardia, A-fib with aberrancy.  Cardiology was consulted for further evaluation.   Interval History:  - Patient seen and examined this morning, sitting upright in bedside chair with family at bedside.  - Denies SOB, CP, palpitations. On room air. HR somewhat improved. BP on the low side this AM but she is without dizziness/lightheadedness.  - Creatinine stable, nephro following.  Review of systems complete and found to be negative unless listed above   Vitals:   04/21/24 0559 04/21/24 0600 04/21/24 0801 04/21/24 0824  BP: 110/83  (!) 88/69   Pulse: (!) 110  87   Resp: 17 17 16    Temp: 97.6 F (36.4 C)  97.8 F (36.6 C)   TempSrc: Oral     SpO2: 97%  96% 97%  Weight:      Height:         Intake/Output Summary (Last 24 hours) at 04/21/2024 0847 Last data filed at 04/20/2024 1912 Gross per 24 hour  Intake 360 ml  Output --  Net 360 ml     PHYSICAL EXAM General: Chronically ill appearing female, well nourished, in no acute distress. HEENT: Normocephalic and atraumatic. Neck: No JVD.  Lungs: Normal respiratory effort on room air. Clear bilaterally to auscultation. No wheezes, crackles, rhonchi.  Heart: Irregularly irregular, controlled rate. Normal S1 and S2 without gallops or murmurs. Radial & DP pulses 2+ bilaterally. Abdomen: Non-distended appearing.  Msk: Normal strength and tone for age. Extremities: No clubbing,  cyanosis or edema.   Neuro: Alert and oriented X 3. Psych: Mood appropriate, affect congruent.    LABS: Basic Metabolic Panel: Recent Labs    04/20/24 0451 04/21/24 0439  NA 137 137  K 4.6 4.6  CL 99 99  CO2 26 27  GLUCOSE 112* 139*  BUN 50* 44*  CREATININE 1.40* 1.54*  CALCIUM  8.8* 9.0   Liver Function Tests: No results for input(s): AST, ALT, ALKPHOS, BILITOT, PROT, ALBUMIN in the last 72 hours.  No results for input(s): LIPASE, AMYLASE in the last 72 hours. CBC: Recent Labs    04/20/24 0451 04/21/24 0439  WBC 10.9* 12.6*  HGB 10.6* 11.4*  HCT 33.7* 36.9  MCV 91.6 92.7  PLT 266 259   Cardiac Enzymes: No results for input(s): CKTOTAL, CKMB, CKMBINDEX, TROPONINIHS in the last 72 hours. BNP: No results for input(s): BNP in the last 72 hours. D-Dimer: No results for input(s): DDIMER in the last 72 hours. Hemoglobin A1C: No results for input(s): HGBA1C in the last 72 hours.  Fasting Lipid Panel: No results for input(s): CHOL, HDL, LDLCALC, TRIG, CHOLHDL, LDLDIRECT in the last 72 hours. Thyroid  Function Tests: No results for input(s): TSH, T4TOTAL, T3FREE, THYROIDAB in the last 72 hours.  Invalid input(s): FREET3  Anemia Panel: No results for input(s): VITAMINB12, FOLATE, FERRITIN, TIBC, IRON, RETICCTPCT in the last 72 hours.  No results found.    ECHO as above  TELEMETRY (personally reviewed): atrial fibrillation LBBB rate 90s  EKG (personally reviewed): Atrial fibrillation RVR LBBB rate 150  bpm  DATA reviewed by me 04/21/24: last 24h vitals tele labs imaging I/O, hospitalist progress note  Principal Problem:   Atrial fibrillation with RVR (HCC) Active Problems:   Acute CHF (congestive heart failure) (HCC)   CAP (community acquired pneumonia)   Dyslipidemia   AKI (acute kidney injury)   Uncontrolled type 2 diabetes mellitus with hyperglycemia, without long-term current use of insulin   (HCC)   GERD without esophagitis   COPD with acute exacerbation (HCC)    ASSESSMENT AND PLAN: [name] is a 69 y.o. female with a past medical history of hypertension, COPD, type 2 diabetes, depression who presented to the ED on 04/15/2024 for worsening shortness of breath.  Initial EKG with wide-complex tachycardia, A-fib with aberrancy.  Cardiology was consulted for further evaluation.   # Atrial fibrillation RVR with abberancy # Acute HFrEF # Community acquired pneumonia # Acute COPD exacerbation # AKI on CKD Patient presented with worsening shortness of breath, also found to be in wide-complex tachycardia on EKG which appears to be atrial fibrillation with aberrancy.  Rate overall improved with IV amiodarone  and digoxin .  Echo this admission with a EF 30-35%, global hypokinesis, moderate MR with echodensity of mitral valve chord, moderate to severe AR. - Transition to p.o. amiodarone  400 mg twice daily for 7 days followed by 200 mg daily. Continue metoprolol  succinate 75 mg twice daily. BP limiting further GDMT titration. - Will hold off on further digoxin  dosing due to elevated dig level.  - Continue Eliquis  5 mg twice daily.  This plan was discussed with pharmacy as she is currently on carbamazepine  which has a DDI.  Primary team discussed with psych who advised it was ok to decrease carbamazepine  dose.  - Continue Jardiance  25 mg daily. - Continue Crestor  40 mg daily. - Can consider cardiac MRI, other ischemic evaluation outpatient.  Ok for discharge today from a cardiac perspective. Will arrange for follow up in clinic with Dr. Florencio on 04/26/24 at 10 AM.    This patient's case was discussed and created with Dr. Florencio and he is in agreement.  Signed:  Danita Bloch, PA-C  04/21/2024, 8:47 AM [facility] Cardiology

## 2024-04-21 NOTE — TOC Transition Note (Addendum)
 Transition of Care [facility]) - Discharge Note   Patient Details  Name: Amber Stevenson MRN: [id number] Date of Birth: 1956-01-05  Transition of Care [facility] CM/SW Contact:  Victory Jackquline RAMAN, RN Phone Number: 04/21/2024, 11:56 AM   Clinical Narrative:    Patient discharge home. Patient doesn't feel like she needs HH/PT. She has an appointment with her PCP on Tuesday and if anything changes she will have him order it. RNCM ordered RW and BSC via [employer] to be delivered to the patient's bedside. RNCM called patient's son Roselyn @ [phone], notified of patient being discharged and he informed me that he would be transporting the patient home. MD and bedside nurse made aware. Pt has discharge orders, no further concerns. RNCM signing off.   Final next level of care: Home/Self Care Barriers to Discharge: Barriers Resolved   Patient Goals and CMS Choice            Discharge Placement                Patient to be transferred to facility by: Son Name of family member notified: Roselyn Patient and family notified of of transfer: 04/21/24  Discharge Plan and Services Additional resources added to the After Visit Summary for                  DME Arranged: 3-N-1, Walker rolling DME Agency: [employer] Date DME Agency Contacted: 04/21/24   Representative spoke with at DME Agency: Thomasina            Social Drivers of Health (SDOH) Interventions SDOH Screenings   Food Insecurity: No Food Insecurity (04/16/2024)  Housing: Low Risk (04/16/2024)  Transportation Needs: No Transportation Needs (04/16/2024)  Utilities: Not At Risk (04/16/2024)  Financial Resource Strain: Low Risk  (12/01/2023)   Received from [facility]  Social Connections: Patient Declined (04/16/2024)  Tobacco Use: Medium Risk (04/15/2024)     Readmission Risk Interventions     No data to display

## 2024-04-21 NOTE — TOC CM/SW Note (Signed)
 Patient is not able to walk the distance required to go the bathroom, or he/she is unable to safely negotiate stairs required to access the bathroom.  A 3in1 BSC will alleviate this problem

## 2024-04-22 NOTE — Discharge Summary (Signed)
 " Physician Discharge Summary   Patient: Amber Stevenson MRN: [id number] DOB: 27-Sep-1955  Admit date:     04/15/2024  Discharge date: 04/22/24  Discharge Physician: Cresencio Fairly   PCP: Delfina Pao, MD   Recommendations at discharge:   Follow-up with outpatient providers as requested  Discharge Diagnoses: Principal Problem:   Atrial fibrillation with RVR (HCC) Active Problems:   Congestive heart failure (HCC)   CAP (community acquired pneumonia)   Uncontrolled type 2 diabetes mellitus with hyperglycemia, without long-term current use of insulin  (HCC)   COPD with acute exacerbation (HCC)   AKI (acute kidney injury)   Dyslipidemia   GERD without esophagitis   Bronchospasm   Left bundle branch block  Hospital Course: Assessment and Plan:  69 y.o. female with medical history significant for depression, type II diabetes mellitus, hypertension, COPD, fibromyalgia, aortic insufficiency and hypothyroidism presented with DOE, hypoxia.  Was seen outpatient and was prescribed antibiotics and prednisone , felt better for couple days and symptoms continue to worsen.  Patient admitted for A-fib with RVR, acute heart failure, elevated troponin with new LBBB on EKG, pneumonia, COPD exacerbation.  Hospital course as below   1/14: increased metoprolol  for better HR control 1/15: Added diclofenac  gel for left hand pain  Atrial fibrillation with RVR Acute HFrEF, new diagnosis Elevated troponin due to supply/demand ischemia EKG with new LBBB Patient presented with worsening shortness of breath, also found to be in wide-complex tachycardia on EKG which appears to be atrial fibrillation with aberrancy.  Rate overall improved with IV amiodarone  and digoxin .  Echo this admission with a EF 30-35%, global hypokinesis, moderate MR with echodensity of mitral valve chord, moderate to severe AR. - Transition to p.o. amiodarone  400 mg twice daily for 7 days followed by 200 mg daily. Continue metoprolol   succinate 75 mg twice daily. BP limiting further GDMT titration. - Will hold off on further digoxin  dosing due to elevated dig level.  - Continue Eliquis  5 mg twice daily.  This plan was discussed with pharmacy as she is currently on carbamazepine  which has a DDI.  Primary team discussed with psych who advised it was ok to decrease carbamazepine  dose.  - Continue Jardiance  25 mg daily. - Continue Crestor  40 mg daily. - Outpatient follow-up with cardiology.   CAP (community acquired pneumonia) Lactic acidosis Improved with antibiotics treatment Flu/COVID/RSV negative   COPD with acute exacerbation Improved with steroids, nebulizer and inhalers   AKI on CKD stage 3a/b Cr  2.19 -> 1.4, baseline Cr ~ 1.2 US  renal negative for hydronephrosis Seen by nephrology   Hyperkalemia - resolved   Uncontrolled type 2 diabetes mellitus with hyperglycemia, without long-term current use of insulin  Steroid Induced hyperglycemia HbA1c 8.0   Normocytic anemia HTN   Hypothyroidism TSH 5.15, FT4 0.93 Continue home levothyroxine    GERD without esophagitis PPI   Dyslipidemia Continue Crestor    Left hand pain/swelling likely due to IV insertion site injury Use diclofenac  gel   Anxiety Bipolar disorder On carbamazepine  400 mg -> decreased to 200mg  daily, discussed with psychiatry. No hx of seizures Dose decreased due to interactions with Eliquis , did not want to be on Coumadin   Obesity Class I Body mass index is 34.31 kg/m. Outpatient follow up for lifestyle modification and risk factor management       Consultants: Cardiology, nephrology  Disposition: Home Diet recommendation:  Carb modified diet DISCHARGE MEDICATION: Allergies as of 04/21/2024       Reactions   Augmentin [amoxicillin-pot Clavulanate] Diarrhea  Has patient had a PCN reaction causing immediate rash, facial/tongue/throat swelling, SOB or lightheadedness with hypotension: No Has patient had a PCN reaction  causing severe rash involving mucus membranes or skin necrosis: No Has patient had a PCN reaction that required hospitalization: No Has patient had a PCN reaction occurring within the last 10 years: No If all of the above answers are NO, then may proceed with Cephalosporin use.   Gabapentin Other (See Comments)   High doses led to falls   Lisinopril Other (See Comments)   Reaction: Blood pressure         Medication List     STOP taking these medications    amLODipine  10 MG tablet Commonly known as: NORVASC    doxycycline  100 MG capsule Commonly known as: VIBRAMYCIN    losartan  50 MG tablet Commonly known as: COZAAR        TAKE these medications    albuterol  108 (90 Base) MCG/ACT inhaler Commonly known as: VENTOLIN  HFA Inhale 1 puff into the lungs every 4 (four) hours as needed for wheezing or shortness of breath.   albuterol  (2.5 MG/3ML) 0.083% nebulizer solution Commonly known as: PROVENTIL  Take 2.5 mg by nebulization every 4 (four) hours as needed for wheezing or shortness of breath.   amiodarone  200 MG tablet Commonly known as: PACERONE  Take 2 tablets (400 mg total) by mouth 2 (two) times daily for 6 days, THEN 1 tablet (200 mg total) daily. Start taking on: April 21, 2024   aspirin  EC 81 MG tablet Take 81 mg by mouth daily.   carbamazepine  200 MG 12 hr tablet Commonly known as: TEGRETOL  XR Take 1 tablet (200 mg total) by mouth daily. What changed:  medication strength how much to take   diazepam  5 MG tablet Commonly known as: VALIUM  Take 2.5 mg by mouth every 12 (twelve) hours as needed for anxiety.   diclofenac  Sodium 1 % Gel Commonly known as: VOLTAREN  Apply 4 g topically 4 (four) times daily for 5 days.   DULoxetine  60 MG capsule Commonly known as: CYMBALTA  Take 60 mg by mouth daily.   Eliquis  5 MG Tabs tablet Generic drug: apixaban  Take 1 tablet (5 mg total) by mouth 2 (two) times daily.   Florastor 250 MG capsule Generic drug:  saccharomyces boulardii Take 250 mg by mouth 2 (two) times daily.   glipiZIDE  5 MG tablet Commonly known as: GLUCOTROL  Take 10 mg by mouth daily before breakfast.   HYDROcodone -acetaminophen  5-325 MG tablet Commonly known as: NORCO/VICODIN Take 1 tablet by mouth every 8 (eight) hours as needed for moderate pain.   Jardiance  25 MG Tabs tablet Generic drug: empagliflozin  Take 25 mg by mouth daily.   levothyroxine  112 MCG tablet Commonly known as: SYNTHROID  Take 112 mcg by mouth daily.   metFORMIN 1000 MG tablet Commonly known as: GLUCOPHAGE Take 1,000 mg by mouth 2 (two) times daily with a meal.   methocarbamol  500 MG tablet Commonly known as: ROBAXIN  Take 500 mg by mouth 4 (four) times daily as needed for muscle spasms.   metoprolol  succinate 25 MG 24 hr tablet Commonly known as: TOPROL -XL Take 3 tablets (75 mg total) by mouth 2 (two) times daily.   omeprazole 20 MG capsule Commonly known as: PRILOSEC Take 20 mg by mouth daily.   pravastatin 80 MG tablet Commonly known as: PRAVACHOL Take 80 mg by mouth at bedtime.   predniSONE  10 MG tablet Commonly known as: DELTASONE  Take 10 mg by mouth See admin instructions. 3 tabs 2x daily days  1, 2, 3; 2 tabs 2x daily days 4, 5, 6; 1 tab 2x daily days 7, 8, 9; half tab 2x daily days 10, 11 and 12   rosuvastatin  40 MG tablet Commonly known as: CRESTOR  Take 40 mg by mouth daily.   Symbicort 160-4.5 MCG/ACT inhaler Generic drug: budesonide -formoterol Inhale 2 puffs into the lungs 2 (two) times daily.   Vitamin D 50 MCG (2000 UT) tablet Take 2,000 Units by mouth daily.        Follow-up Information     Florencio Cara BIRCH, MD. Go in 1 week(s).   Specialties: Cardiology, Internal Medicine Why: Appointment scheduled for 04/26/24 at 10 AM Contact information: [address] [place] [address] [phone]         Delfina Pao, MD. Schedule an appointment as soon as possible for a visit in 1 week(s).    Specialty: Pediatrics Why: [facility] Discharge F/UP Contact information: [address] [place] [address] [phone]                Discharge Exam: Filed Weights   04/19/24 0450 04/20/24 0500 04/21/24 0500  Weight: 85.2 kg 85.1 kg 83 kg   GENERAL:  69 y.o.-year-old female patient lying in the bed with no acute distress.  NECK:  Supple, no jugular venous distention. No thyroid  enlargement, no tenderness.  LUNGS: CTA b/l.  CARDIOVASCULAR:  irregular, S1, S2 normal. No murmurs, rubs, or gallops.  ABDOMEN: Soft, benign EXTREMITIES: No pedal edema, cyanosis, or clubbing.  NEUROLOGIC: AO x 3, no gross focal deficits SKIN: No obvious rash, lesion, or ulcer  Condition at discharge: good  The results of significant diagnostics from this hospitalization (including imaging, microbiology, ancillary and laboratory) are listed below for reference.   Imaging Studies: US  RENAL Result Date: 04/17/2024 EXAM: RETROPERITONEAL ULTRASOUND OF THE KIDNEYS 04/17/2024 12:02:58 PM TECHNIQUE: Real-time ultrasonography of the retroperitoneum, specifically the kidneys and urinary bladder, was performed. COMPARISON: None available. CLINICAL HISTORY: Acute kidney failure. FINDINGS: RIGHT KIDNEY: Right kidney measures 10.4 x 4.4 x 4.7 cm. Mild cortical thinning is noted. Calculated volume is 113 ml. No hydronephrosis. No mass. LEFT KIDNEY: Left kidney measures 9.7 x 5.8 x 4.4 cm. Calculated volume is 128 ml. Mild cortical thinning is noted. No hydronephrosis. No mass. BLADDER: Decompressed INCIDENTAL FINDINGS: Small bilateral pleural effusions. IMPRESSION: 1. Mild cortical thinning in both kidneys. 2. Small bilateral pleural effusions. Electronically signed by: Oneil Devonshire MD MD 04/17/2024 11:43 PM EST RP Workstation: [id number]   ECHOCARDIOGRAM COMPLETE Result Date: 04/16/2024    ECHOCARDIOGRAM REPORT   Patient Name:   Ura Rettig Date of Exam: 04/16/2024 Medical Rec #:  [id number]       Height:        62.0 in Accession #:    [id number]      Weight:       182.0 lb Date of Birth:  [birth year]       BSA:          1.837 m Patient Age:    68 years        BP:           156/70 mmHg Patient Gender: F               HR:           116 bpm. Exam Location:  Inpatient Procedure: 2D Echo, Color Doppler, Cardiac Doppler and Intracardiac            Opacification Agent (Both Spectral and Color  Flow Doppler were            utilized during procedure). Indications:     CHF-Acute Diastolic  History:         Patient has no prior history of Echocardiogram examinations.                  Risk Factors:Hypertension, Diabetes, Former Smoker and Thyroid                   Disease.  Sonographer:     Logan Shove RDCS Referring Phys:  [id number] JAN A MANSY Diagnosing Phys: Annabella Scarce MD  Sonographer Comments: Technically difficult study due to poor echo windows. IMPRESSIONS  1. Left ventricular ejection fraction, by estimation, is 30 to 35%. The left ventricle has moderately decreased function. The left ventricle demonstrates global hypokinesis. Left ventricular diastolic parameters are indeterminate.  2. Right ventricular systolic function is mildly reduced. The right ventricular size is normal. There is moderately elevated pulmonary artery systolic pressure.  3. Left atrial size was mildly dilated.  4. There is a round echodensity on the mitral valve chord measuring 0.8 x 0.5 cm. This likely represents calcification. Cannot rule out endocarditis. Consider TEE or cardiac MRI to better evaluate. The mitral valve is degenerative. Moderate mitral valve  regurgitation. No evidence of mitral stenosis. The mean mitral valve gradient is 3.3 mmHg with average heart rate of 119 bpm.  5. Tricuspid valve regurgitation is mild to moderate.  6. The aortic valve is normal in structure. Aortic valve regurgitation is moderate to severe. No aortic stenosis is present. Aortic regurgitation PHT measures 237 msec.  7. The inferior vena cava is dilated in size  with <50% respiratory variability, suggesting right atrial pressure of 15 mmHg. FINDINGS  Left Ventricle: Left ventricular ejection fraction, by estimation, is 30 to 35%. The left ventricle has moderately decreased function. The left ventricle demonstrates global hypokinesis. Definity  contrast agent was given IV to delineate the left ventricular endocardial borders. The left ventricular internal cavity size was normal in size. There is no left ventricular hypertrophy. Left ventricular diastolic parameters are indeterminate. Right Ventricle: The right ventricular size is normal. No increase in right ventricular wall thickness. Right ventricular systolic function is mildly reduced. There is moderately elevated pulmonary artery systolic pressure. The tricuspid regurgitant velocity is 3.34 m/s, and with an assumed right atrial pressure of 15 mmHg, the estimated right ventricular systolic pressure is 59.6 mmHg. Left Atrium: Left atrial size was mildly dilated. Right Atrium: Right atrial size was normal in size. Pericardium: There is no evidence of pericardial effusion. Mitral Valve: There is a round echodensity on the mitral valve chord measuring 0.8 x 0.5 cm. This likely represents calcification. Cannot rule out endocarditis. Consider TEE or cardiac MRI to better evaluate. The mitral valve is degenerative in appearance. There is moderate thickening of the mitral valve leaflet(s). There is moderate calcification of the mitral valve leaflet(s). Moderate mitral valve regurgitation. No evidence of mitral valve stenosis. The mean mitral valve gradient is 3.3 mmHg  with average heart rate of 119 bpm. Tricuspid Valve: The tricuspid valve is normal in structure. Tricuspid valve regurgitation is mild to moderate. No evidence of tricuspid stenosis. Aortic Valve: The aortic valve is normal in structure. Aortic valve regurgitation is moderate to severe. Aortic regurgitation PHT measures 237 msec. No aortic stenosis is present.  Aortic valve peak gradient measures 13.5 mmHg. Pulmonic Valve: The pulmonic valve was normal in structure. Pulmonic valve regurgitation is not visualized. No evidence  of pulmonic stenosis. Aorta: The aortic root is normal in size and structure. Venous: The inferior vena cava is dilated in size with less than 50% respiratory variability, suggesting right atrial pressure of 15 mmHg. IAS/Shunts: No atrial level shunt detected by color flow Doppler.  LEFT VENTRICLE PLAX 2D LVIDd:         4.10 cm LVIDs:         3.30 cm LV PW:         0.90 cm LV IVS:        0.80 cm LVOT diam:     1.90 cm LVOT Area:     2.84 cm  LV Volumes (MOD) LV vol d, MOD A2C: 143.0 ml LV vol d, MOD A4C: 106.0 ml LV vol s, MOD A2C: 92.9 ml LV vol s, MOD A4C: 67.7 ml LV SV MOD A2C:     50.1 ml LV SV MOD A4C:     106.0 ml LV SV MOD BP:      42.2 ml RIGHT VENTRICLE            IVC RV Basal diam:  3.50 cm    IVC diam: 2.00 cm RV S prime:     7.34 cm/s TAPSE (M-mode): 1.8 cm LEFT ATRIUM             Index        RIGHT ATRIUM           Index LA diam:        3.80 cm 2.07 cm/m   RA Area:     17.40 cm LA Vol (A2C):   88.7 ml 48.30 ml/m  RA Volume:   45.00 ml  24.50 ml/m LA Vol (A4C):   31.7 ml 17.26 ml/m LA Biplane Vol: 54.4 ml 29.62 ml/m  AORTIC VALVE AV Area (Vmax): 1.53 cm AV Vmax:        183.60 cm/s AV Peak Grad:   13.5 mmHg LVOT Vmax:      98.98 cm/s AI PHT:         237 msec  AORTA Ao Root diam: 2.30 cm Ao Asc diam:  3.20 cm MITRAL VALVE                  TRICUSPID VALVE MV Mean grad: 3.3 mmHg        TR Peak grad:   44.6 mmHg MR Peak grad:    90.6 mmHg    TR Mean grad:   26.0 mmHg MR Mean grad:    56.0 mmHg    TR Vmax:        334.00 cm/s MR Vmax:         476.00 cm/s  TR Vmean:       232.0 cm/s MR Vmean:        350.0 cm/s MR PISA:         5.09 cm     SHUNTS MR PISA Eff ROA: 41 mm       Systemic Diam: 1.90 cm MR PISA Radius:  0.90 cm Annabella Scarce MD Electronically signed by Annabella Scarce MD Signature Date/Time: 04/16/2024/12:54:51 PM    Final     DG Chest Portable 1 View Result Date: 04/15/2024 CLINICAL DATA:  Short of breath EXAM: PORTABLE CHEST 1 VIEW COMPARISON:  02/11/2021 FINDINGS: Single frontal view of the chest demonstrates an enlarged cardiac silhouette. Bibasilar veiling opacities, right greater than left, consistent with consolidation and/or effusions. No pneumothorax. No acute bony abnormalities. IMPRESSION: 1. Constellation of findings suggesting congestive  heart failure, with bibasilar edema and effusions. Electronically Signed   By: Ozell Daring M.D.   On: 04/15/2024 23:06    Microbiology: Results for orders placed or performed during the hospital encounter of 04/15/24  Resp panel by RT-PCR (RSV, Flu A&B, Covid) Anterior Nasal Swab     Status: None   Collection Time: 04/15/24 10:20 PM   Specimen: Anterior Nasal Swab  Result Value Ref Range Status   SARS Coronavirus 2 by RT PCR NEGATIVE NEGATIVE Final    Comment: (NOTE) SARS-CoV-2 target nucleic acids are NOT DETECTED.  The SARS-CoV-2 RNA is generally detectable in upper respiratory specimens during the acute phase of infection. The lowest concentration of SARS-CoV-2 viral copies this assay can detect is 138 copies/mL. A negative result does not preclude SARS-Cov-2 infection and should not be used as the sole basis for treatment or other patient management decisions. A negative result may occur with  improper specimen collection/handling, submission of specimen other than nasopharyngeal swab, presence of viral mutation(s) within the areas targeted by this assay, and inadequate number of viral copies(<138 copies/mL). A negative result must be combined with clinical observations, patient history, and epidemiological information. The expected result is Negative.  Fact Sheet for Patients:  bloggercourse.com  Fact Sheet for Healthcare Providers:  seriousbroker.it  This test is no t yet approved or cleared by the  [place]  FDA and  has been authorized for detection and/or diagnosis of SARS-CoV-2 by FDA under an Emergency Use Authorization (EUA). This EUA will remain  in effect (meaning this test can be used) for the duration of the COVID-19 declaration under Section 564(b)(1) of the Act, 21 U.S.C.section 360bbb-3(b)(1), unless the authorization is terminated  or revoked sooner.       Influenza A by PCR NEGATIVE NEGATIVE Final   Influenza B by PCR NEGATIVE NEGATIVE Final    Comment: (NOTE) The Xpert Xpress SARS-CoV-2/FLU/RSV plus assay is intended as an aid in the diagnosis of influenza from Nasopharyngeal swab specimens and should not be used as a sole basis for treatment. Nasal washings and aspirates are unacceptable for Xpert Xpress SARS-CoV-2/FLU/RSV testing.  Fact Sheet for Patients: bloggercourse.com  Fact Sheet for Healthcare Providers: seriousbroker.it  This test is not yet approved or cleared by the [place]  FDA and has been authorized for detection and/or diagnosis of SARS-CoV-2 by FDA under an Emergency Use Authorization (EUA). This EUA will remain in effect (meaning this test can be used) for the duration of the COVID-19 declaration under Section 564(b)(1) of the Act, 21 U.S.C. section 360bbb-3(b)(1), unless the authorization is terminated or revoked.     Resp Syncytial Virus by PCR NEGATIVE NEGATIVE Final    Comment: (NOTE) Fact Sheet for Patients: bloggercourse.com  Fact Sheet for Healthcare Providers: seriousbroker.it  This test is not yet approved or cleared by the [place]  FDA and has been authorized for detection and/or diagnosis of SARS-CoV-2 by FDA under an Emergency Use Authorization (EUA). This EUA will remain in effect (meaning this test can be used) for the duration of the COVID-19 declaration under Section 564(b)(1) of the Act, 21 U.S.C. section  360bbb-3(b)(1), unless the authorization is terminated or revoked.  Performed at [facility], [address] [place], [place] [address]   Blood culture (routine x 2)     Status: None   Collection Time: 04/15/24 10:20 PM   Specimen: BLOOD  Result Value Ref Range Status   Specimen Description BLOOD BLOOD LEFT ARM  Final   Special Requests  Final    BOTTLES DRAWN AEROBIC AND ANAEROBIC Blood Culture adequate volume   Culture   Final    NO GROWTH 5 DAYS Performed at [facility], [address] [place], [place] [address]    Report Status 04/20/2024 FINAL  Final  Blood culture (routine x 2)     Status: None   Collection Time: 04/15/24 10:20 PM   Specimen: BLOOD  Result Value Ref Range Status   Specimen Description BLOOD BLOOD RIGHT FOREARM  Final   Special Requests   Final    BOTTLES DRAWN AEROBIC AND ANAEROBIC Blood Culture adequate volume   Culture   Final    NO GROWTH 5 DAYS Performed at [facility], [address] [place], [place] [address]    Report Status 04/20/2024 FINAL  Final  Respiratory (~20 pathogens) panel by PCR     Status: None   Collection Time: 04/16/24  3:01 PM   Specimen: Nasopharyngeal Swab; Respiratory  Result Value Ref Range Status   Adenovirus NOT DETECTED NOT DETECTED Final   Coronavirus 229E NOT DETECTED NOT DETECTED Final    Comment: (NOTE) The Coronavirus on the Respiratory Panel, DOES NOT test for the novel  Coronavirus (2019 nCoV)    Coronavirus HKU1 NOT DETECTED NOT DETECTED Final   Coronavirus NL63 NOT DETECTED NOT DETECTED Final   Coronavirus OC43 NOT DETECTED NOT DETECTED Final   Metapneumovirus NOT DETECTED NOT DETECTED Final   Rhinovirus / Enterovirus NOT DETECTED NOT DETECTED Final   Influenza A NOT DETECTED NOT DETECTED Final   Influenza B NOT DETECTED NOT DETECTED Final   Parainfluenza Virus 1 NOT DETECTED NOT DETECTED Final   Parainfluenza Virus 2 NOT DETECTED NOT DETECTED Final   Parainfluenza Virus  3 NOT DETECTED NOT DETECTED Final   Parainfluenza Virus 4 NOT DETECTED NOT DETECTED Final   Respiratory Syncytial Virus NOT DETECTED NOT DETECTED Final   Bordetella pertussis NOT DETECTED NOT DETECTED Final   Bordetella Parapertussis NOT DETECTED NOT DETECTED Final   Chlamydophila pneumoniae NOT DETECTED NOT DETECTED Final   Mycoplasma pneumoniae NOT DETECTED NOT DETECTED Final    Comment: Performed at [facility], [address], [place], [place] [address]    Labs: CBC: Recent Labs  Lab 04/15/24 2220 04/16/24 0450 04/17/24 0451 04/18/24 0358 04/19/24 0402 04/20/24 0451 04/21/24 0439  WBC 14.6*   < > 17.8* 18.6* 12.6* 10.9* 12.6*  NEUTROABS 9.9*  --   --   --   --   --   --   HGB 11.1*   < > 10.5* 10.3* 10.6* 10.6* 11.4*  HCT 35.3*   < > 33.2* 32.5* 32.7* 33.7* 36.9  MCV 92.2   < > 90.0 90.8 89.1 91.6 92.7  PLT 350   < > 321 377 286 266 259   < > = values in this interval not displayed.   Basic Metabolic Panel: Recent Labs  Lab 04/15/24 2220 04/16/24 0450 04/17/24 0451 04/17/24 1952 04/18/24 0358 04/19/24 0402 04/20/24 0451 04/21/24 0439  NA 132*   < > 132*  --  133* 135 137 137  K 5.3*   < > 5.4* 4.9 5.0 4.3 4.6 4.6  CL 97*   < > 94*  --  94* 97* 99 99  CO2 19*   < > 21*  --  23 23 26 27   GLUCOSE 305*   < > 194*  --  104* 160* 112* 139*  BUN 47*   < > 67*  --  82* 70* 50* 44*  CREATININE 1.30*   < > 1.94*  --  2.19* 1.59* 1.40* 1.54*  CALCIUM  9.4   < > 8.9  --  8.5* 8.2* 8.8* 9.0  MG 2.4  --  2.5*  --   --   --   --   --    < > = values in this interval not displayed.   Liver Function Tests: Recent Labs  Lab 04/15/24 2220  AST 20  ALT 23  ALKPHOS 72  BILITOT 0.4  PROT 8.0  ALBUMIN 4.4   CBG: Recent Labs  Lab 04/20/24 0857 04/20/24 1251 04/20/24 1641 04/20/24 2010 04/21/24 0811  GLUCAP 260* 183* 287* 242* 230*    Discharge time spent: greater than 30 minutes.  Signed: Cresencio Fairly, MD [facility] 04/22/2024 "

## 2024-05-05 ENCOUNTER — Other Ambulatory Visit: Payer: Self-pay | Admitting: Emergency Medicine

## 2024-05-05 DIAGNOSIS — R0602 Shortness of breath: Secondary | ICD-10-CM

## 2024-05-05 DIAGNOSIS — Z87891 Personal history of nicotine dependence: Secondary | ICD-10-CM

## 2024-05-05 DIAGNOSIS — J449 Chronic obstructive pulmonary disease, unspecified: Secondary | ICD-10-CM

## 2024-05-05 DIAGNOSIS — Z122 Encounter for screening for malignant neoplasm of respiratory organs: Secondary | ICD-10-CM

## 2024-05-11 ENCOUNTER — Other Ambulatory Visit: Payer: Self-pay
